# Patient Record
Sex: Female | Born: 1975 | Race: White | Hispanic: No | Marital: Married | Smoking: Never smoker
Health system: Southern US, Community
[De-identification: ages and names within clinical notes are randomized; demographics above are authoritative.]

## PROBLEM LIST (undated history)

## (undated) DIAGNOSIS — J189 Pneumonia, unspecified organism: Secondary | ICD-10-CM

## (undated) DIAGNOSIS — F909 Attention-deficit hyperactivity disorder, unspecified type: Secondary | ICD-10-CM

## (undated) DIAGNOSIS — M1990 Unspecified osteoarthritis, unspecified site: Secondary | ICD-10-CM

## (undated) DIAGNOSIS — E669 Obesity, unspecified: Secondary | ICD-10-CM

## (undated) DIAGNOSIS — I10 Essential (primary) hypertension: Secondary | ICD-10-CM

---

## 1997-10-29 ENCOUNTER — Emergency Department (HOSPITAL_COMMUNITY): Admission: EM | Admit: 1997-10-29 | Discharge: 1997-10-29 | Payer: Self-pay

## 1999-01-31 ENCOUNTER — Emergency Department (HOSPITAL_COMMUNITY): Admission: EM | Admit: 1999-01-31 | Discharge: 1999-02-01 | Payer: Self-pay | Admitting: *Deleted

## 1999-04-25 ENCOUNTER — Inpatient Hospital Stay (HOSPITAL_COMMUNITY): Admission: AD | Admit: 1999-04-25 | Discharge: 1999-04-25 | Payer: Self-pay | Admitting: Obstetrics

## 1999-04-25 ENCOUNTER — Encounter: Payer: Self-pay | Admitting: *Deleted

## 1999-05-29 ENCOUNTER — Inpatient Hospital Stay (HOSPITAL_COMMUNITY): Admission: AD | Admit: 1999-05-29 | Discharge: 1999-05-29 | Payer: Self-pay | Admitting: Obstetrics & Gynecology

## 1999-06-25 ENCOUNTER — Encounter: Admission: RE | Admit: 1999-06-25 | Discharge: 1999-06-25 | Payer: Self-pay | Admitting: Obstetrics

## 1999-07-03 ENCOUNTER — Ambulatory Visit (HOSPITAL_COMMUNITY): Admission: RE | Admit: 1999-07-03 | Discharge: 1999-07-03 | Payer: Self-pay | Admitting: Obstetrics & Gynecology

## 1999-07-15 ENCOUNTER — Encounter: Admission: RE | Admit: 1999-07-15 | Discharge: 1999-07-15 | Payer: Self-pay | Admitting: Obstetrics & Gynecology

## 1999-07-29 ENCOUNTER — Encounter: Admission: RE | Admit: 1999-07-29 | Discharge: 1999-07-29 | Payer: Self-pay | Admitting: Obstetrics & Gynecology

## 1999-08-05 ENCOUNTER — Ambulatory Visit (HOSPITAL_COMMUNITY): Admission: RE | Admit: 1999-08-05 | Discharge: 1999-08-05 | Payer: Self-pay

## 1999-08-05 ENCOUNTER — Encounter: Payer: Self-pay | Admitting: *Deleted

## 1999-09-10 ENCOUNTER — Encounter: Admission: RE | Admit: 1999-09-10 | Discharge: 1999-09-10 | Payer: Self-pay | Admitting: Obstetrics

## 1999-09-16 ENCOUNTER — Ambulatory Visit (HOSPITAL_COMMUNITY): Admission: RE | Admit: 1999-09-16 | Discharge: 1999-09-16 | Payer: Self-pay | Admitting: Obstetrics

## 1999-09-21 ENCOUNTER — Encounter (HOSPITAL_COMMUNITY): Admission: RE | Admit: 1999-09-21 | Discharge: 1999-10-23 | Payer: Self-pay | Admitting: *Deleted

## 1999-10-02 ENCOUNTER — Inpatient Hospital Stay (HOSPITAL_COMMUNITY): Admission: AD | Admit: 1999-10-02 | Discharge: 1999-10-05 | Payer: Self-pay | Admitting: *Deleted

## 1999-10-12 ENCOUNTER — Inpatient Hospital Stay (HOSPITAL_COMMUNITY): Admission: AD | Admit: 1999-10-12 | Discharge: 1999-10-12 | Payer: Self-pay | Admitting: Obstetrics

## 1999-10-19 ENCOUNTER — Inpatient Hospital Stay (HOSPITAL_COMMUNITY): Admission: AD | Admit: 1999-10-19 | Discharge: 1999-10-19 | Payer: Self-pay | Admitting: Obstetrics

## 1999-10-19 ENCOUNTER — Encounter: Payer: Self-pay | Admitting: Obstetrics

## 1999-10-22 ENCOUNTER — Inpatient Hospital Stay (HOSPITAL_COMMUNITY): Admission: AD | Admit: 1999-10-22 | Discharge: 1999-10-26 | Payer: Self-pay | Admitting: *Deleted

## 1999-10-23 ENCOUNTER — Encounter (INDEPENDENT_AMBULATORY_CARE_PROVIDER_SITE_OTHER): Payer: Self-pay

## 1999-10-29 ENCOUNTER — Inpatient Hospital Stay (HOSPITAL_COMMUNITY): Admission: AD | Admit: 1999-10-29 | Discharge: 1999-10-29 | Payer: Self-pay | Admitting: Obstetrics & Gynecology

## 2000-02-11 ENCOUNTER — Inpatient Hospital Stay (HOSPITAL_COMMUNITY): Admission: AD | Admit: 2000-02-11 | Discharge: 2000-02-11 | Payer: Self-pay | Admitting: Obstetrics & Gynecology

## 2000-05-11 ENCOUNTER — Encounter: Payer: Self-pay | Admitting: *Deleted

## 2000-05-11 ENCOUNTER — Emergency Department (HOSPITAL_COMMUNITY): Admission: EM | Admit: 2000-05-11 | Discharge: 2000-05-11 | Payer: Self-pay | Admitting: *Deleted

## 2001-09-12 ENCOUNTER — Other Ambulatory Visit: Admission: RE | Admit: 2001-09-12 | Discharge: 2001-09-12 | Payer: Self-pay | Admitting: Obstetrics & Gynecology

## 2001-09-13 ENCOUNTER — Encounter: Payer: Self-pay | Admitting: Obstetrics & Gynecology

## 2001-09-13 ENCOUNTER — Ambulatory Visit (HOSPITAL_COMMUNITY): Admission: RE | Admit: 2001-09-13 | Discharge: 2001-09-13 | Payer: Self-pay | Admitting: Obstetrics & Gynecology

## 2001-10-30 ENCOUNTER — Inpatient Hospital Stay (HOSPITAL_COMMUNITY): Admission: AD | Admit: 2001-10-30 | Discharge: 2001-10-30 | Payer: Self-pay | Admitting: *Deleted

## 2001-11-27 ENCOUNTER — Encounter: Payer: Self-pay | Admitting: Obstetrics

## 2001-11-27 ENCOUNTER — Ambulatory Visit (HOSPITAL_COMMUNITY): Admission: RE | Admit: 2001-11-27 | Discharge: 2001-11-27 | Payer: Self-pay | Admitting: Obstetrics

## 2002-03-12 ENCOUNTER — Inpatient Hospital Stay (HOSPITAL_COMMUNITY): Admission: AD | Admit: 2002-03-12 | Discharge: 2002-03-31 | Payer: Self-pay | Admitting: Obstetrics

## 2002-03-13 ENCOUNTER — Encounter: Payer: Self-pay | Admitting: Obstetrics

## 2002-03-16 ENCOUNTER — Encounter: Payer: Self-pay | Admitting: Obstetrics

## 2002-03-19 ENCOUNTER — Encounter: Payer: Self-pay | Admitting: Obstetrics

## 2002-03-22 ENCOUNTER — Encounter: Payer: Self-pay | Admitting: Obstetrics

## 2002-03-26 ENCOUNTER — Encounter: Payer: Self-pay | Admitting: Obstetrics & Gynecology

## 2002-03-27 ENCOUNTER — Encounter (INDEPENDENT_AMBULATORY_CARE_PROVIDER_SITE_OTHER): Payer: Self-pay | Admitting: Specialist

## 2003-06-07 ENCOUNTER — Emergency Department (HOSPITAL_COMMUNITY): Admission: EM | Admit: 2003-06-07 | Discharge: 2003-06-07 | Payer: Self-pay | Admitting: Emergency Medicine

## 2003-06-12 ENCOUNTER — Emergency Department (HOSPITAL_COMMUNITY): Admission: EM | Admit: 2003-06-12 | Discharge: 2003-06-12 | Payer: Self-pay | Admitting: Emergency Medicine

## 2004-01-07 ENCOUNTER — Ambulatory Visit: Payer: Self-pay | Admitting: Internal Medicine

## 2005-01-15 ENCOUNTER — Ambulatory Visit (HOSPITAL_COMMUNITY): Admission: RE | Admit: 2005-01-15 | Discharge: 2005-01-15 | Payer: Self-pay | Admitting: Obstetrics

## 2006-07-22 ENCOUNTER — Ambulatory Visit (HOSPITAL_COMMUNITY): Admission: RE | Admit: 2006-07-22 | Discharge: 2006-07-22 | Payer: Self-pay | Admitting: Obstetrics & Gynecology

## 2006-07-28 ENCOUNTER — Encounter: Admission: RE | Admit: 2006-07-28 | Discharge: 2006-10-26 | Payer: Self-pay | Admitting: Obstetrics & Gynecology

## 2006-10-20 ENCOUNTER — Encounter: Admission: RE | Admit: 2006-10-20 | Discharge: 2007-01-18 | Payer: Self-pay | Admitting: Obstetrics & Gynecology

## 2006-12-29 ENCOUNTER — Telehealth (INDEPENDENT_AMBULATORY_CARE_PROVIDER_SITE_OTHER): Payer: Self-pay | Admitting: Family Medicine

## 2007-02-27 ENCOUNTER — Encounter: Admission: RE | Admit: 2007-02-27 | Discharge: 2007-05-28 | Payer: Self-pay | Admitting: Obstetrics & Gynecology

## 2007-04-27 ENCOUNTER — Encounter: Payer: Self-pay | Admitting: Obstetrics & Gynecology

## 2007-04-27 ENCOUNTER — Ambulatory Visit (HOSPITAL_COMMUNITY): Admission: RE | Admit: 2007-04-27 | Discharge: 2007-04-27 | Payer: Self-pay | Admitting: Obstetrics & Gynecology

## 2008-10-07 ENCOUNTER — Ambulatory Visit: Payer: Self-pay | Admitting: Physician Assistant

## 2008-10-07 LAB — CONVERTED CEMR LAB
ALT: 14 units/L (ref 0–35)
AST: 13 units/L (ref 0–37)
Albumin: 4.3 g/dL (ref 3.5–5.2)
Alkaline Phosphatase: 50 units/L (ref 39–117)
BUN: 11 mg/dL (ref 6–23)
Basophils Absolute: 0 10*3/uL (ref 0.0–0.1)
Basophils Relative: 0 % (ref 0–1)
CO2: 17 meq/L — ABNORMAL LOW (ref 19–32)
Calcium: 8.8 mg/dL (ref 8.4–10.5)
Chloride: 110 meq/L (ref 96–112)
Creatinine, Ser: 0.68 mg/dL (ref 0.40–1.20)
Eosinophils Absolute: 0.1 10*3/uL (ref 0.0–0.7)
Eosinophils Relative: 2 % (ref 0–5)
Glucose, Bld: 92 mg/dL (ref 70–99)
HCT: 41 % (ref 36.0–46.0)
Hemoglobin: 13.1 g/dL (ref 12.0–15.0)
Lymphocytes Relative: 29 % (ref 12–46)
Lymphs Abs: 1.4 10*3/uL (ref 0.7–4.0)
MCHC: 32 g/dL (ref 30.0–36.0)
MCV: 87.4 fL (ref 78.0–100.0)
Monocytes Absolute: 0.3 10*3/uL (ref 0.1–1.0)
Monocytes Relative: 6 % (ref 3–12)
Neutro Abs: 3 10*3/uL (ref 1.7–7.7)
Neutrophils Relative %: 63 % (ref 43–77)
Platelets: 186 10*3/uL (ref 150–400)
Potassium: 4.4 meq/L (ref 3.5–5.3)
RBC: 4.69 M/uL (ref 3.87–5.11)
RDW: 15.1 % (ref 11.5–15.5)
Sodium: 142 meq/L (ref 135–145)
TSH: 2.285 microintl units/mL (ref 0.350–4.500)
Total Bilirubin: 0.3 mg/dL (ref 0.3–1.2)
Total Protein: 6.7 g/dL (ref 6.0–8.3)
WBC: 4.7 10*3/uL (ref 4.0–10.5)

## 2008-10-08 ENCOUNTER — Encounter: Payer: Self-pay | Admitting: Physician Assistant

## 2008-10-11 ENCOUNTER — Ambulatory Visit: Payer: Self-pay | Admitting: Physician Assistant

## 2008-10-17 ENCOUNTER — Ambulatory Visit: Payer: Self-pay | Admitting: Physician Assistant

## 2008-10-29 ENCOUNTER — Ambulatory Visit: Payer: Self-pay | Admitting: Internal Medicine

## 2008-11-06 ENCOUNTER — Ambulatory Visit: Payer: Self-pay | Admitting: Family Medicine

## 2008-11-18 ENCOUNTER — Ambulatory Visit: Payer: Self-pay | Admitting: Physician Assistant

## 2008-11-18 DIAGNOSIS — F4310 Post-traumatic stress disorder, unspecified: Secondary | ICD-10-CM

## 2009-02-05 ENCOUNTER — Emergency Department (HOSPITAL_COMMUNITY): Admission: EM | Admit: 2009-02-05 | Discharge: 2009-02-05 | Payer: Self-pay | Admitting: Emergency Medicine

## 2009-02-17 ENCOUNTER — Ambulatory Visit: Payer: Self-pay | Admitting: Physician Assistant

## 2009-02-17 DIAGNOSIS — R030 Elevated blood-pressure reading, without diagnosis of hypertension: Secondary | ICD-10-CM

## 2009-02-26 ENCOUNTER — Ambulatory Visit: Payer: Self-pay | Admitting: Physician Assistant

## 2009-03-26 ENCOUNTER — Ambulatory Visit: Payer: Self-pay | Admitting: Physician Assistant

## 2009-04-03 ENCOUNTER — Telehealth: Payer: Self-pay | Admitting: Physician Assistant

## 2009-04-30 ENCOUNTER — Ambulatory Visit: Payer: Self-pay | Admitting: Physician Assistant

## 2009-05-26 ENCOUNTER — Ambulatory Visit: Payer: Self-pay | Admitting: Physician Assistant

## 2009-06-20 ENCOUNTER — Ambulatory Visit: Payer: Self-pay | Admitting: Physician Assistant

## 2009-10-27 ENCOUNTER — Emergency Department (HOSPITAL_COMMUNITY): Admission: EM | Admit: 2009-10-27 | Discharge: 2009-10-27 | Payer: Self-pay | Admitting: Emergency Medicine

## 2010-03-29 ENCOUNTER — Encounter: Payer: Self-pay | Admitting: Obstetrics & Gynecology

## 2010-04-09 NOTE — Progress Notes (Signed)
Summary: review med request  Phone Note Call from Patient Call back at [phone]   Summary of Call: Mariana Wiederholt PT. [place] [facility] CALLED AND WANTS TO KNOW IF ITS OK TO TAKE THIS VITAMIN CALLED [facility]. IT HAS BIOTIN 1MG , ACETYL-L-CARNITINE 250MG , PHYTOSONE 400MG , AND L-CARNINTINE 250MG . SHE WANTS TO KNOW IF IT WILL BE SAFE. Initial call taken by: Leodis Rains,  April 03, 2009 1:06 PM  Follow-up for Phone Call        will forward to provider for review.... Follow-up by: Mikey College CMA,  April 04, 2009 10:06 AM  Additional Follow-up for Phone Call Additional follow up Details #1::        yes Additional Follow-up by: Brynda Rim,  April 04, 2009 3:15 PM    Additional Follow-up for Phone Call Additional follow up Details #2::    spoke with pt and let her know its okay to take med Follow-up by: [place] Shannon,  April 09, 2009 11:20 AM

## 2010-05-22 LAB — URINALYSIS, ROUTINE W REFLEX MICROSCOPIC
Bilirubin Urine: NEGATIVE
Glucose, UA: NEGATIVE mg/dL
Hgb urine dipstick: NEGATIVE
Ketones, ur: NEGATIVE mg/dL
Nitrite: NEGATIVE
Protein, ur: NEGATIVE mg/dL
Specific Gravity, Urine: 1.028 (ref 1.005–1.030)
Urobilinogen, UA: 0.2 mg/dL (ref 0.0–1.0)
pH: 6 (ref 5.0–8.0)

## 2010-05-22 LAB — POCT I-STAT, CHEM 8
BUN: 11 mg/dL (ref 6–23)
Calcium, Ion: 1.11 mmol/L — ABNORMAL LOW (ref 1.12–1.32)
Chloride: 109 mEq/L (ref 96–112)
Creatinine, Ser: 0.8 mg/dL (ref 0.4–1.2)
Glucose, Bld: 95 mg/dL (ref 70–99)
HCT: 41 % (ref 36.0–46.0)
Hemoglobin: 13.9 g/dL (ref 12.0–15.0)
Potassium: 3.9 mEq/L (ref 3.5–5.1)
Sodium: 142 mEq/L (ref 135–145)
TCO2: 23 mmol/L (ref 0–100)

## 2010-05-22 LAB — PREGNANCY, URINE: Preg Test, Ur: NEGATIVE

## 2010-07-21 NOTE — Op Note (Signed)
[name], Sue Flores            ACCOUNT NO.:  [id number]   MEDICAL RECORD NO.:  [id number]          PATIENT TYPE:  AMB   LOCATION:  SDC                           FACILITY:  [facility]   PHYSICIAN:  Roseanna Rainbow, M.D.DATE OF BIRTH:  13-Feb-1976   DATE OF PROCEDURE:  04/27/2007  DATE OF DISCHARGE:                               OPERATIVE REPORT   PREOPERATIVE DIAGNOSIS:  Dysfunctional uterine bleeding.   POSTOPERATIVE DIAGNOSIS:  Dysfunctional uterine bleeding.   PROCEDURE:  Diagnostic dilatation and curettage, hysteroscopy, NovaSure  ablation.   SURGEON:  Roseanna Rainbow, M.D.   ANESTHESIA:  Laryngeal mask airway, paracervical block.   PATHOLOGY:  Endometrial curettings.   ESTIMATED BLOOD LOSS:  Minimal.   COMPLICATIONS:  None.   PROCEDURES:  The patient was taken to the operating room with an IV  running.  A laryngeal mask airway was placed.  She was then placed in  the dorsal lithotomy position and prepped and draped in the usual  sterile fashion.  After a time-out had been completed, a weighted  speculum and Sims retractor were then placed into the vagina.  The  anterior lip of the cervix was infiltrated with 2 mL of 1% lidocaine.  The single-tooth tenaculum was then applied to this location.  4 mL of  1% lidocaine were then injected at 4 and 7 o'clock to produce a  paracervical block.  The cervix was dilated with Shawnie Pons dilators.  The  NovaSure sound was then introduced to obtain both cervical length and  intracavitary length measurements.  These were 6 and 4 cm, respectively.  A diagnostic hysteroscopy was then performed using lactated Ringer's as  a distending medium.  The endometrial lining had a somewhat polypoid  appearance.  However, there are no discrete lesions noted.  A sharp  curettage was then performed.  The NovaSure device was then introduced  into the intrauterine cavity and seated.  The intercornual width was 4  cm.  The intracavitary integrity  test was then performed and passed.  The ablation cycle was then started and completed.  The device was then  removed.  A repeat hysteroscopic survey of the endometrial cavity was  consistent with satisfactory results from the ablation.  All the  instruments were then removed.  There is minimal bleeding noted from the  cervix.  At the close of the procedure, the instrument and pack counts  were said to be correct x2.  The patient was taken to the PACU awake and  in stable condition.      Roseanna Rainbow, M.D.  Electronically Signed    LAJ/MEDQ  D:  04/27/2007  T:  04/28/2007  Job:  [id number]
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
## 2010-07-24 NOTE — Op Note (Signed)
[facility]  Patient:    Sue Flores, Sue Flores                       MRN: [id number] Proc. Date: 10/24/99 Adm. Date:  [id number] Disc. Date: [id number] Attending:  Michaelle Copas                           Operative Report  PREOPERATIVE DIAGNOSES:       56. A 35 year old intrauterine pregnancy, twin                                  gestation.                               2. Intrauterine growth retardation.                               3. History of previous cesarean, declines trial                                  of labor.  POSTOPERATIVE DIAGNOSES:      31. A 35 year old intrauterine pregnancy, twin                                  gestation.                               2. Intrauterine growth retardation.                               3. History of previous cesarean, declines trial                                  of labor.                               4. Right ovarian cyst.  OPERATION:                    1. Tertiary low transverse cesarean section.                               2. Modified Pomeroy bilateral tubal ligation.                               3. Right ovarian cystectomy via Pfannenstiel.  SURGEON:                      Roseanna Rainbow, M.D.  ASSISTANT:  ANESTHESIA:                   Epidural anesthesia.  COMPLICATIONS:                None.  ESTIMATED BLOOD LOSS:  One liter.  FLUIDS:                       Two liters lactated Ringers.  URINE OUTPUT:                 100 cc of clear urine.  INDICATIONS:                  The patient is a 35 year old para 2 at 13 weeks with a twin gestation complicated by intrauterine growth restriction.  She was undergoing induction of labor with oxytocin, maximum dilatation 3 cm, at which point she declined for further efforts at a trial of labor.  FINDINGS:                     The presenting infant was vertex.  The twin B was breech.  There was a right-sided ovarian cyst  approximately 5 x 5 cm with a mucinous material in the contents.  DESCRIPTION OF PROCEDURE:     The patient was taken to the operating room where epidural anesthetic was found to be adequate. She was then prepped and draped in the normal sterile fashion in the dorsal supine position with a leftward tilt.  A Pfannenstiel skin incision was then made with a scalpel and carried through to the underlying layer of fascia.  The fascia was incised in the midline and the incision extended laterally with Mayo scissors.  The superior aspect of the fascial incision was then grasped with Kocher clamps, elevated and the underlying rectus muscles dissected off.  Attention was then turned to the inferior aspect of this incision which was manipulated in a similar fashion.  The rectus muscles were separated in the midline.  Of note, at this point the omentum was noted to be adherent to the parietoperitoneum and rectus muscles.  A window was noted in the parietoperitoneum and this was extended sharply.  As the exposure was limited, the rectus muscles were divided transversely with cautery approximately one third of the belly on each side.  The bladder blade was inserted and the lower uterine segment incised in a transverse fashion with the scalpel.  The bladder blade was inserted and the vesicouterine peritoneum identified, grasped with pickups and entered sharply with Metzenbaum scissors.  This incision was extended laterally and the bladder flap created sharply.  The bladder blade was reinserted and the lower uterine segment incised in a transverse fashion with the scalpel. The uterine incision was then extended bluntly.  The bladder blade was removed and infant As head was delivered atraumatically. The nose and mouth were suctioned with the bulb suction and the cord clamped and cut.  The infant was handed off to the awaiting pediatrician.  Twin B was delivered by a complete breech extraction.  The cord  was clamped and cut.  The infant was handed off again to the awaiting pediatricians.  The placenta was then removed. The uterus exteriorized and cleared of all clots and debris. The uterine incision was repaired with 0 Monocryl in a running locked fashion.  Excellent hemostasis was noted.  Attention was then turned toward the fallopian tubes.  The left fallopian tube was identified, grasped with a Babcock clamp.  The clamp was used to grasp the tube approximately 4 cm from the cornu.  A 2 to 3 cm segment of tube was then doubly ligated with three ties of plain gut and excised. Excellent hemostasis was noted. The right fallopian tube  was then manipulated in a similar fashion.  The right ovarian cyst was noted as above.  The right ovarian capsule was cored with the capsule circumferentially at the interface between the cyst and the ovarian tissue.  The cortex was then incised along the area that had been scored with the scalpel.  The dissection was performed of the tissue between the cyst and the ovarian capsule. The cyst was then removed.  The bleeding points in the ovarian cortex were then secured with hemostats and suture ligated with 3-0 Monocryl.  Excellent hemostasis was noted.  The remaining ovarian cortex was then reapproximated with vertical mattress sutures.  Again, excellent hemostasis was noted.  The uterus was then returned to the abdomen.  The gutters were cleared of all clots. The fascia was reapproximated with 0 PDS in a running fashion. The skin was closed with staples. The patient tolerated the procedure well.  Sponge, lap and needle counts correct x 2.  There were 2 g of cefazolin given at cord clamp.  The patient was taken to the PACU in stable condition. DD:  10/26/99 TD:  10/27/99 Job: [id number]

## 2010-07-24 NOTE — Op Note (Signed)
Sue Flores                          ACCOUNT NO.:  [id number]   MEDICAL RECORD NO.:  [id number]                   PATIENT TYPE:  INP   LOCATION:  9180                                 FACILITY:  [facility]   PHYSICIAN:  Roseanna Rainbow, M.D.         DATE OF BIRTH:  Mar 10, 1975   DATE OF PROCEDURE:  03/27/2002  DATE OF DISCHARGE:                                 OPERATIVE REPORT   PREOPERATIVE DIAGNOSES:  1. Thirty-six plus weeks' intrauterine pregnancy.  2. Severe pregnancy-induced hypertension.  3. History of two previous cesarean deliveries, elects to proceed with     repeat cesarean delivery.  4. Failed tubal ligation.   PROCEDURES:  1. Repeat low transverse cesarean section.  2. Bilateral salpingectomies via Pfannenstiel.   SURGEON:  Roseanna Rainbow, M.D.   ANESTHESIA:  Spinal.   COMPLICATIONS:  None.   ESTIMATED BLOOD LOSS:  800 cc.   FLUIDS AND URINE OUTPUT:  As per anesthesiology.   INDICATIONS FOR PROCEDURE:  The patient is a 35 year old multipara at 6 +  weeks' with severe pregnancy-induced hypertension.   FINDINGS:  Female infant in cephalic presentation.  Neonatology present at  delivery.  Normal uterus and ovaries.  The isthmic portions of the tubes  were attenuated bilaterally.   PROCEDURES:  The patient was taken to the operating room where her spinal  anesthetic was found to be adequate.  She was then prepped and draped in the  usual sterile fashion in a dorsal supine position with leftward tilt.  A  Pfannenstiel skin incision was then made with scalpel and carried through to  the underlying layer of fascia.  The fascia was incised in the midline and  the incision extended laterally with Mayo scissors.  The superior aspect of  the fascial incision was then grasped with Kocher clamps, elevated, and the  underlying rectus muscles dissected off sharply.  Attention was then turned  to the inferior aspect of this incision which was manipulated in  a similar  fashion.  The rectus muscles were separated in the midline.  The parietal  peritoneum was tented up and entered sharply with Metzenbaum scissors.  The  peritoneal incision was then extended superiorly and inferiorly with good  visualization of the bladder.  At this point, there were adhesions noted  involving the omentum to the parietal peritoneum extending above the  bladder.  These adhesions were clamped with Kelly clamps, divided, and  sutured with free ligatures of 2-0 Vicryl.  Excellent hemostasis was noted.  To achieve better exposure, the medial aspects of the bellies of the rectus  muscles were divided approximately one-third horizontally with the Bovie.  The bladder flap was created sharply.  The bladder blade was then reinserted  and the lower uterine segment incised in a transverse fashion with the  scalpel.  The uterine incision was then extended laterally bluntly.  The  bladder blade was then removed and the infant's head  delivered  atraumatically.  The nose and mouth were suctioned with the bulb suction.  The cord and clamped and cut.  The infant was handed off  to the waiting  neonatologist.  Cord gases were sent.  The placenta was then removed.  The  uterus exteriorized and cleared of all clots and debris.  The uterine  incision was repaired with 0 Monocryl in a running lock fashion.  Excellent  hemostasis was noted.  At this point, the mesosalpinx was serially clamped  progressing from the fimbriated end to the interstitial portion of the tube  on the right.  The pedicles were secured with free ligatures of 2-0 Vicryl.  This was repeated on the patient's left.  Again, excellent hemostasis was  noted.  The uterus was returned to the abdomen.  The gutters were cleared of  all clots and debris.  The fascia was reapproximated with 0 PDS in a running  fashion.  The subcutaneous fat layer was reapproximated with 3-0 plain.  The  skin was closed with staples.  The  patient tolerated the procedure well.  The sponge, lap, needle, and instrument counts were correct x2.  One gram of  cephazolin was given at cord clamp.  The patient was taken to the PACU in  stable condition.                                               Roseanna Rainbow, M.D.    Sue Flores  D:  03/27/2002  T:  03/27/2002  Job:  [id number]

## 2010-07-24 NOTE — Discharge Summary (Signed)
[facility]  Patient:    Sue Flores, Sue Flores                       MRN: [id number] Adm. Date:  [id number] Disc. Date: [id number] Attending:  Antionette Char                           Discharge Summary  PROCEDURES:                   1. Tertiary low transverse cesarean section.                               2. Modified Pomeroy bilateral tubal ligation.                               3. Right ovarian cystectomy via Pfannenstiel.  HOSPITAL SUMMARY:             The patient is a 35 year old, G3, now para 3, who presented at 2 weeks with twin gestation complicated by intrauterine growth restriction.  She was undergoing induction of labor with oxytocin with a maximum dilatation of 3 cm, at which point she declined further efforts at a trial of labor.  The patient was taken to the operating room and a tertiary low transverse cesarean section was performed by Roseanna Rainbow, M.D. The presenting infant was vertex.  Twin B was breech.  There was a right-sided ovarian cyst approximately 5 x 5 cm with mucinous material and contacts was also found.  The patient underwent these procedures via epidural anesthesia. Baby A weighed 4 pounds 10 ounces.  Baby B weighed 3 pounds 10 ounces.  The patient tolerated the procedure well and was taken to the recovery room and then to mother/baby in stable condition.  The patient did well postoperatively and received routine postoperative care.  On the third day postoperatively, the mother appeared to be doing quite well and recovering without problems. She was breast feeding.  She was deemed stable to be discharged to home.  DISCHARGE CONDITION:          Good.  DISPOSITION:                  Discharged to home.  DISCHARGE MEDICATIONS:        1. Tylenol No. 3 one tablet p.o. q.4h. as needed                                  for pain.                               2. FESO4 one tablet p.o. b.i.d.  WOUND CARE:                    The patient was given specific wound care instructions.  DIET:                         The patient was told to resume a regular diet.  ACTIVITY:                     The patient is to have no heavy lifting.  Per the booklet, the patient is to have vaginal rest for six weeks with no sexual activity.  FOLLOW-UP:                    The patient will follow up in three days at [facility] for staple removal.  The patient will follow up with her OB/GYN at her six-week postpartum visit.  The patient voiced agreement and understanding of the above discharge plans and had no further questions. DD:  01/01/00 TD:  01/01/00 Job: [id number]

## 2010-07-24 NOTE — Discharge Summary (Signed)
Sue Flores, Sue Flores                          ACCOUNT NO.:  [id number]   MEDICAL RECORD NO.:  [id number]                   PATIENT TYPE:  INP   LOCATION:  9180                                 FACILITY:  [facility]   PHYSICIAN:  Charles A. Clearance Coots, M.D.             DATE OF BIRTH:  [birth year]   DATE OF ADMISSION:  03/12/2002  DATE OF DISCHARGE:                                 DISCHARGE SUMMARY   ADMISSION DIAGNOSES:  1. [redacted] weeks gestation.  2. Pre-eclampsia with thrombocytopenia.   DISCHARGE DIAGNOSES:  1. [redacted] weeks gestation.  2. Pre-eclampsia with thrombocytopenia.  3. Status post repeat low transverse Cesarean section and bilateral     salpingectomy.  4. Delivered viable female infant on March 27, 2002 at 0600 with Apgar's of     9 at one minute and 9 at five minutes and weight of 2585 grams and length     of 48 cm.   CONDITION ON DISCHARGE:  Mother and infant discharged home in good  condition.   REASON FOR ADMISSION:  The patient is a 35 year old white female, Gravida  IV, Para IV with an estimated date of confinement of about April 22, 2002, now at [redacted] weeks gestation with pre-eclampsia with thrombocytopenia.  Prenatal care had been done at the [facility] by Dr. Ilene QuaChristell Constant. The patient has had a rather complicated prenatal course until  development of increased blood pressure, headache, and low platelets over  the past week. The patient was therefore admitted to the hospital for  bedrest and supportive management.   PAST SURGICAL HISTORY:  Cesarean section times two with tubal ligation after  the second Cesarean section.   PAST MEDICAL HISTORY:  Depression.   MEDICATIONS:  Aldomet and prenatal vitamins.   ALLERGIES:  No known drug allergies.   PAST OB-GYN HISTORY:  Two previous C-sections, bilateral tubal ligation as  well as ovarian cystectomy after the second Cesarean section. Negative  history of sexually transmitted diseases.   SOCIAL  HISTORY:  Single. Works as a Conservation officer, nature at [employer]. Denies tobacco,  alcohol, or recreations drug use.   PHYSICAL EXAMINATION:  GENERAL: Obese white female in no acute distress.  VITAL SIGNS: Blood pressure 136/88. Weight 270 pounds.  ABDOMEN: Gravid and nontender. Fetal heart tones are 120 to 130 beats per  minute.  GU: Cervical examination was omitted.  EXTREMITIES: Hand and lower extremity pre-tibial edema, 1-2+. Deep tendon  reflexes are 3+ patella.   LABORATORY DATA:  UA was negative for protein.   IMPRESSION:  Multipara at [redacted] weeks gestation with pre-eclampsia associated  with mild thrombocytopenia.   PLAN:  Admit. Repeat pre-eclampsia labs and obtain a complete OB ultrasound  for growth and will start a 24 hour urine for creatinine clearance and total  protein.   LABORATORY DATA:  Hemoglobin and hematocrit 10.5 and 30.7. WBC count 9,000.  Platelets 114,000. CMP remarkable for  creatinine of 0.5, uric acid of 4.4.  Other liver enzymes were within normal limits.   HOSPITAL COURSE:  The patient was admitted and placed on bedrest and  responded well to bedrest. First 24 hour urine done on March 16, 2002  revealed total protein of 160 mg over 24 hour period. She continued to  respond well to bedrest and her platelet count ranged from 114 to 118,000 up  until March 22, 2002 when the platelet count dropped to 110,000. Repeat 24  hour urine was done and revealed total protein of 242 mg per 24 hours. The  patient continued on bedrest and on March 26, 2002 the platelet count was  100,000. The decision was made to proceed with Cesarean section delivery  because of continued thrombocytopenia, now at a critical level of 100,000  platelet count. Recommendation was explained to the patient and she agreed  to the procedure. The risks and complications and benefits of the procedure  were explained. The patient desired to have definitive sterilization  procedure done at the time of  Cesarean section with removal of both tubes  and this was also agreed to. She is taken for Cesarean section delivery on  March 27, 2002. She underwent repeat low transverse Cesarean section and  bilateral salpingectomy. There are no intraoperative complications.  Postoperative and postpartum course were uncomplicated.  The patient and  infant were discharged home on postoperative day four in good condition.   DISCHARGE LABORATORY DATA:  Hemoglobin and hematocrit 9.6 and 28.5, WBC  count 7,600, platelets 103,000.   DISCHARGE MEDICATIONS:  1. Tylox 1-2 tabs every four to six hours as needed pain.  2. Ibuprofen 800 mg every eight hours to supplement the Tylox as needed for     pain.  3. Continue prenatal vitamins daily.  4. Hemocyte plus one tab by mouth daily.   SPECIAL INSTRUCTIONS:  The patient was given routine written instructions  per booklet for OB discharge.   FOLLOW UP:  To follow-up at the [facility] with Dr. Tamela Oddi  in six weeks.                                               Charles A. Clearance Coots, M.D.    CAH/MEDQ  D:  03/31/2002  T:  03/31/2002  Job:  [id number]   cc:   Leonette Most A. Clearance Coots, M.D.  [address]  [place]  [place] [address]  Fax: [phone]
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
## 2010-11-30 LAB — CBC
HCT: 37.3
Hemoglobin: 13
MCHC: 34.7
MCV: 81.1
Platelets: 193
RBC: 4.6
RDW: 14.7
WBC: 7.3

## 2011-07-12 ENCOUNTER — Observation Stay (HOSPITAL_COMMUNITY)
Admission: EM | Admit: 2011-07-12 | Discharge: 2011-07-13 | Disposition: A | Discharge status: Home / Self Care | Payer: Self-pay | Attending: Emergency Medicine | Admitting: Emergency Medicine

## 2011-07-12 ENCOUNTER — Encounter (HOSPITAL_COMMUNITY): Payer: Self-pay | Admitting: Emergency Medicine

## 2011-07-12 ENCOUNTER — Emergency Department (HOSPITAL_COMMUNITY): Payer: Self-pay

## 2011-07-12 DIAGNOSIS — R030 Elevated blood-pressure reading, without diagnosis of hypertension: Secondary | ICD-10-CM

## 2011-07-12 DIAGNOSIS — I10 Essential (primary) hypertension: Secondary | ICD-10-CM | POA: Insufficient documentation

## 2011-07-12 DIAGNOSIS — R079 Chest pain, unspecified: Principal | ICD-10-CM | POA: Insufficient documentation

## 2011-07-12 DIAGNOSIS — E669 Obesity, unspecified: Secondary | ICD-10-CM | POA: Insufficient documentation

## 2011-07-12 HISTORY — DX: Obesity, unspecified: E66.9

## 2011-07-12 HISTORY — DX: Essential (primary) hypertension: I10

## 2011-07-12 LAB — CBC
HCT: 37.6 % (ref 36.0–46.0)
Hemoglobin: 12.6 g/dL (ref 12.0–15.0)
MCH: 28 pg (ref 26.0–34.0)
MCHC: 33.5 g/dL (ref 30.0–36.0)
MCV: 83.6 fL (ref 78.0–100.0)
Platelets: 179 10*3/uL (ref 150–400)
RBC: 4.5 MIL/uL (ref 3.87–5.11)
RDW: 14.1 % (ref 11.5–15.5)

## 2011-07-12 LAB — DIFFERENTIAL
Basophils Absolute: 0 10*3/uL (ref 0.0–0.1)
Eosinophils Absolute: 0.1 10*3/uL (ref 0.0–0.7)
Eosinophils Relative: 2 % (ref 0–5)
Lymphs Abs: 1.5 10*3/uL (ref 0.7–4.0)
Monocytes Absolute: 0.4 10*3/uL (ref 0.1–1.0)
Monocytes Relative: 6 % (ref 3–12)
Neutro Abs: 4.3 10*3/uL (ref 1.7–7.7)
Neutrophils Relative %: 68 % (ref 43–77)

## 2011-07-12 LAB — POCT I-STAT TROPONIN I
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
## 2011-07-12 NOTE — ED Notes (Signed)
PT. REPORTS MID CHEST PRESSURE FOR SEVERAL DAYS WITH SOB , DENIES COUGH OR NAUSEA.

## 2011-07-13 ENCOUNTER — Other Ambulatory Visit: Payer: Self-pay

## 2011-07-13 DIAGNOSIS — R072 Precordial pain: Secondary | ICD-10-CM

## 2011-07-13 LAB — D-DIMER, QUANTITATIVE: D-Dimer, Quant: 0.35 ug/mL-FEU (ref 0.00–0.48)

## 2011-07-13 LAB — BASIC METABOLIC PANEL
BUN: 13 mg/dL (ref 6–23)
CO2: 24 mEq/L (ref 19–32)
Calcium: 9.4 mg/dL (ref 8.4–10.5)
Chloride: 105 mEq/L (ref 96–112)
Creatinine, Ser: 0.89 mg/dL (ref 0.50–1.10)
GFR calc non Af Amer: 82 mL/min — ABNORMAL LOW (ref >90)
Glucose, Bld: 106 mg/dL — ABNORMAL HIGH (ref 70–99)
Potassium: 3.8 mEq/L (ref 3.5–5.1)
Sodium: 140 mEq/L (ref 135–145)

## 2011-07-13 LAB — POCT I-STAT TROPONIN I
Troponin i, poc: 0.02 ng/mL (ref 0.00–0.08)
Troponin i, poc: 0.01 ng/mL (ref 0.00–0.08)

## 2011-07-13 MED ORDER — SODIUM CHLORIDE 0.9 % IV SOLN
1000.0000 mL | INTRAVENOUS | Status: DC
  Administered 2011-07-13 (×2): 1000 mL via INTRAVENOUS

## 2011-07-13 MED ORDER — MORPHINE SULFATE 4 MG/ML IJ SOLN
4.0000 mg | Freq: Once | INTRAMUSCULAR | Status: AC
  Administered 2011-07-13: 4 mg via INTRAVENOUS
  Filled 2011-07-13: qty 1

## 2011-07-13 MED ORDER — MORPHINE SULFATE 4 MG/ML IJ SOLN
2.0000 mg | Freq: Once | INTRAMUSCULAR | Status: AC
  Administered 2011-07-13: 2 mg via INTRAVENOUS
  Filled 2011-07-13: qty 1

## 2011-07-13 MED ORDER — KETOROLAC TROMETHAMINE 30 MG/ML IJ SOLN
30.0000 mg | Freq: Once | INTRAMUSCULAR | Status: AC
  Administered 2011-07-13: 30 mg via INTRAVENOUS
  Filled 2011-07-13: qty 1

## 2011-07-13 MED ORDER — ASPIRIN 81 MG PO CHEW
324.0000 mg | CHEWABLE_TABLET | Freq: Once | ORAL | Status: AC
  Administered 2011-07-13: 324 mg via ORAL
  Filled 2011-07-13: qty 4

## 2011-07-13 MED ORDER — LORAZEPAM 1 MG PO TABS
1.0000 mg | ORAL_TABLET | Freq: Once | ORAL | Status: AC
  Administered 2011-07-13: 1 mg via ORAL
  Filled 2011-07-13: qty 1

## 2011-07-13 MED ORDER — ALBUTEROL SULFATE HFA 108 (90 BASE) MCG/ACT IN AERS
2.0000 | INHALATION_SPRAY | RESPIRATORY_TRACT | Status: DC | PRN
  Filled 2011-07-13: qty 6.7

## 2011-07-13 NOTE — ED Notes (Signed)
Repeat EKG was done and given to Dr.Miller. ?

## 2011-07-13 NOTE — Progress Notes (Signed)
  Echocardiogram Echocardiogram Stress Test has been performed.  Dorena Cookey 07/13/2011, 11:35 AM

## 2011-07-13 NOTE — ED Notes (Signed)
RT called to give inhaler to pt with education.

## 2011-07-13 NOTE — ED Provider Notes (Signed)
Assumed care at 62:58 AM. 36 year old female presents with, currently on chest pain protocol. Patient has had negative serial troponin and unchanged EKGs throughout the night.  Plan for stress echocardiogram this morning secondary to persistent mild left-sided chest pain. She does not have a primary care Dr., and request for resources.  Alert and oriented and in no acute distress. On exam, heart with regular rate and rhythm, no murmurs rubs, or gallops.  Lungs clear to auscultation bilaterally, abdomen is soft, nontender with normal bowel sounds. No evidence of pedal edema.    9:44 AM Pt c/o cp prior to stress echocardiogram.  Will continue with pain management until chest pain resolves prior to stress echo.    11:11 AM Radiologist, Dr. Myrtis Ser called to notify that stress echo is non-diagnostic as pt were experiencing CP and her heart rates increases to 120s.  Pt has had 3 negative serial troponin and 3 sets of ECG that shows no changes.  Although pt does not have any significant risk factors for PE, her O2 sats were at 92% earlier during the night.  Will obtain d-dimer to r/o PE.  Pt currently in NAD.  Discussed with my attending.   1:32 PM Pt has negative d-dimer.  Is chest pain free.  Recommend f/u with cardiology for further evaluation.  Pt voice understanding and agrees with plan.    Fayrene Helper, PA-C 07/13/11 1333

## 2011-07-13 NOTE — ED Notes (Signed)
Pt transported to [facility] for stress test.

## 2011-07-13 NOTE — ED Notes (Signed)
RT at bedside.

## 2011-07-13 NOTE — ED Notes (Signed)
The pts pressure is better but sh has had a sharp pain beneath her lt breast since she has been in this room

## 2011-07-13 NOTE — ED Notes (Signed)
[name] from [facility] contacted RN stating pt can not be on O2 for test. NT went to remove pt from O2 per Greta Doom, PA's order. Pt also c/o chest pressure- PA made aware and no new orders received. [name] states, they may be unable to do test if pt is having chest pressure.

## 2011-07-13 NOTE — ED Notes (Signed)
The pt has had chest pressure for 2hours worse with inspiration.  She has a history of the same

## 2011-07-13 NOTE — ED Notes (Signed)
Pt returned from Stress Test, pt still c/o chest pressure and pain under left breast. Orders received for Morphine, will give medication and continue to monitor.

## 2011-07-13 NOTE — Discharge Instructions (Signed)
Evaluate for chest pain. Your stress test is nondiagnostic at this time. Please follow up with cardiologist for further evaluation. Return to ER if you have any concerns or if your chest pain worsen.  You may use inhaler (2 puffs every 6 hours as needed for shortness of breath).  Chest Pain (Nonspecific) It is often hard to give a specific diagnosis for the cause of chest pain. There is always a chance that your pain could be related to something serious, such as a heart attack or a blood clot in the lungs. You need to follow up with your caregiver for further evaluation. CAUSES   Heartburn.   Pneumonia or bronchitis.   Anxiety or stress.   Inflammation around your heart (pericarditis) or lung (pleuritis or pleurisy).   A blood clot in the lung.   A collapsed lung (pneumothorax). It can develop suddenly on its own (spontaneous pneumothorax) or from injury (trauma) to the chest.   Shingles infection (herpes zoster virus).  The chest wall is composed of bones, muscles, and cartilage. Any of these can be the source of the pain.  The bones can be bruised by injury.   The muscles or cartilage can be strained by coughing or overwork.   The cartilage can be affected by inflammation and become sore (costochondritis).  DIAGNOSIS  Lab tests or other studies, such as X-rays, electrocardiography, stress testing, or cardiac imaging, may be needed to find the cause of your pain.  TREATMENT   Treatment depends on what may be causing your chest pain. Treatment may include:   Acid blockers for heartburn.   Anti-inflammatory medicine.   Pain medicine for inflammatory conditions.   Antibiotics if an infection is present.   You may be advised to change lifestyle habits. This includes stopping smoking and avoiding alcohol, caffeine, and chocolate.   You may be advised to keep your head raised (elevated) when sleeping. This reduces the chance of acid going backward from your stomach into your  esophagus.   Most of the time, nonspecific chest pain will improve within 2 to 3 days with rest and mild pain medicine.  HOME CARE INSTRUCTIONS   If antibiotics were prescribed, take your antibiotics as directed. Finish them even if you start to feel better.   For the next few days, avoid physical activities that bring on chest pain. Continue physical activities as directed.   Do not smoke.   Avoid drinking alcohol.   Only take over-the-counter or prescription medicine for pain, discomfort, or fever as directed by your caregiver.   Follow your caregiver's suggestions for further testing if your chest pain does not go away.   Keep any follow-up appointments you made. If you do not go to an appointment, you could develop lasting (chronic) problems with pain. If there is any problem keeping an appointment, you must call to reschedule.  SEEK MEDICAL CARE IF:   You think you are having problems from the medicine you are taking. Read your medicine instructions carefully.   Your chest pain does not go away, even after treatment.   You develop a rash with blisters on your chest.  SEEK IMMEDIATE MEDICAL CARE IF:   You have increased chest pain or pain that spreads to your arm, neck, jaw, back, or abdomen.   You develop shortness of breath, an increasing cough, or you are coughing up blood.   You have severe back or abdominal pain, feel nauseous, or vomit.   You develop severe weakness, fainting, or chills.  You have a fever.  THIS IS AN EMERGENCY. Do not wait to see if the pain will go away. Get medical help at once. Call your local emergency services (911 in [place] Do not drive yourself to the hospital. MAKE SURE YOU:   Understand these instructions.   Will watch your condition.   Will get help right away if you are not doing well or get worse.  Document Released: 12/02/2004 Document Revised: 02/11/2011 Document Reviewed: 09/28/2007 [facility] Patient Information 2012 [place],  [place].   RESOURCE GUIDE  Dental Problems  Patients with Medicaid: [facility]                     [phone] [address]                                           Phone:  [phone]                                                  If unable to pay or uninsured, contact:  [facility] or [facility]. to become qualified for the adult dental clinic.  Chronic Pain Problems Contact [facility]  [phone] Patients need to be referred by their primary care doctor.  Insufficient Money for Medicine Contact [employer] Way:  call "211" or [facility] [phone].  No Primary Care Doctor Call [facility]  [phone] Other agencies that provide inexpensive medical care    [facility]  [phone]    [facility]  [phone]    [facility]  [phone]    [facility]  [phone]    [facility]  [phone]    [facility]  [phone]  Substance Abuse Resources [facility]  [phone] [facility] [phone] [facility] [place] [facility] [phone] Floydene Flock [phone] Residential & Outpatient Substance Abuse Program  2798307254  Psychological Services [facility]  [phone] [facility]  [phone] [facility]   [phone] (emergency services [phone])  Abuse/Neglect [facility] Child Abuse Hotline 707-103-1592 [facility] Child Abuse Hotline 681-411-2427 (After Hours)  Emergency Shelter [facility] (636)663-5887  [facility] [phone] [facility] [phone]  MRSA Hotline #:   606 104 2801    [facility] Resources  [facility]  [employer]                           [facility] Dept. [address] [place]                     [address]         [address]  Patrecia Pace  [facility] Phone:  [phone]                                  Phone:  [phone]                   Phone:  [phone]  [facility] Phone:  [phone]  [facility] Child Abuse Hotline [phone] 8726861949 (After Hours)

## 2011-07-13 NOTE — ED Notes (Signed)
Pt's O2 can be removed per Greta Doom, PA.

## 2014-09-04 ENCOUNTER — Encounter (HOSPITAL_COMMUNITY): Payer: Self-pay | Admitting: *Deleted

## 2014-09-04 ENCOUNTER — Emergency Department (HOSPITAL_COMMUNITY): Payer: BLUE CROSS/BLUE SHIELD

## 2014-09-04 ENCOUNTER — Emergency Department (HOSPITAL_COMMUNITY)
Admission: EM | Admit: 2014-09-04 | Discharge: 2014-09-04 | Disposition: A | Discharge status: Home / Self Care | Payer: BLUE CROSS/BLUE SHIELD | Attending: Emergency Medicine | Admitting: Emergency Medicine

## 2014-09-04 DIAGNOSIS — E669 Obesity, unspecified: Secondary | ICD-10-CM | POA: Diagnosis not present

## 2014-09-04 DIAGNOSIS — I10 Essential (primary) hypertension: Secondary | ICD-10-CM | POA: Diagnosis not present

## 2014-09-04 DIAGNOSIS — Y998 Other external cause status: Secondary | ICD-10-CM | POA: Insufficient documentation

## 2014-09-04 DIAGNOSIS — W108XXA Fall (on) (from) other stairs and steps, initial encounter: Secondary | ICD-10-CM | POA: Diagnosis not present

## 2014-09-04 DIAGNOSIS — S99922A Unspecified injury of left foot, initial encounter: Secondary | ICD-10-CM | POA: Diagnosis present

## 2014-09-04 DIAGNOSIS — Y9389 Activity, other specified: Secondary | ICD-10-CM | POA: Insufficient documentation

## 2014-09-04 DIAGNOSIS — S93105A Unspecified dislocation of left toe(s), initial encounter: Secondary | ICD-10-CM

## 2014-09-04 DIAGNOSIS — W19XXXA Unspecified fall, initial encounter: Secondary | ICD-10-CM

## 2014-09-04 DIAGNOSIS — S99912A Unspecified injury of left ankle, initial encounter: Secondary | ICD-10-CM | POA: Insufficient documentation

## 2014-09-04 DIAGNOSIS — Y9289 Other specified places as the place of occurrence of the external cause: Secondary | ICD-10-CM | POA: Insufficient documentation

## 2014-09-04 DIAGNOSIS — Z8659 Personal history of other mental and behavioral disorders: Secondary | ICD-10-CM | POA: Insufficient documentation

## 2014-09-04 DIAGNOSIS — S93115A Dislocation of interphalangeal joint of left lesser toe(s), initial encounter: Secondary | ICD-10-CM | POA: Insufficient documentation

## 2014-09-04 HISTORY — DX: Attention-deficit hyperactivity disorder, unspecified type: F90.9

## 2014-09-04 MED ORDER — LIDOCAINE HCL (PF) 1 % IJ SOLN
10.0000 mL | Freq: Once | INTRAMUSCULAR | Status: AC
  Administered 2014-09-04: 10 mL
  Filled 2014-09-04: qty 10

## 2014-09-04 MED ORDER — HYDROCODONE-ACETAMINOPHEN 5-325 MG PO TABS
2.0000 | ORAL_TABLET | Freq: Once | ORAL | Status: AC
  Administered 2014-09-04: 2 via ORAL
  Filled 2014-09-04: qty 2

## 2014-09-04 NOTE — ED Notes (Signed)
Heather PA at bedside.  

## 2014-09-04 NOTE — ED Provider Notes (Signed)
CSN: [id number]     Arrival date & time 09/04/14  1438 History   This chart was scribed for Santiago GladHeather Keino Placencia, PA-C working with Gerhard Munchobert Lockwood, MD by Elveria Risingimelie Horne, ED Scribe. This patient was seen in room TR06C/TR06C and the patient's care was started at 2:53 PM.   Chief Complaint  Patient presents with  . Fall  . Foot Pain    The history is provided by the patient. No language interpreter was used.   HPI Comments: Sue Flores is a 39 y.o. female who presents to the Emergency Department of left foot and ankle injury sustained approximately two hours ago after mechanical trip and fall. Patient reports tripping when walking out of her front door and falling down her doorsteps; patient reports falling forward and states she was able to catch herself with outstretched hands. Patient denies striking her head or loss of consciousness. Patient reports pain shooting up her left lower leg at time of injury. Patient noticed deformed left fourth digit following the fall and experiencing sharp pain with attempting to reduce it. Patient ambulatory since incident with difficulty due to pain severity. Patient reports exacerbated pain to dorsum of foot and ankle with movement and pain to toes with movement.  She denies any pain of the wrist, hand, fingers, shoulders, neck, or back.      Past Medical History  Diagnosis Date  . Hypertension   . Obesity   . Obesity   . ADHD (attention deficit hyperactivity disorder)    Past Surgical History  Procedure Laterality Date  . Cesarean section     History reviewed. No pertinent family history. History  Substance Use Topics  . Smoking status: Never Smoker   . Smokeless tobacco: Not on file  . Alcohol Use: No   OB History    No data available     Review of Systems  Constitutional: Negative for fever and chills.  Musculoskeletal: Positive for joint swelling and arthralgias.  Skin:       Abrasion  Neurological: Negative for numbness.       Allergies  Review of patient's allergies indicates no known allergies.  Home Medications   Prior to Admission medications   Medication Sig Start Date End Date Taking? Authorizing Provider  ibuprofen (ADVIL,MOTRIN) 200 MG tablet Take 400-600 mg by mouth every 6 (six) hours as needed. For pain    Historical Provider, MD   Triage Vitals: BP 132/84 mmHg  Pulse 87  Temp(Src) 97.9 F (36.6 C) (Oral)  Resp 18  SpO2 98% Physical Exam  Constitutional: She is oriented to person, place, and time. She appears well-developed and well-nourished. No distress.  HENT:  Head: Normocephalic and atraumatic.  Eyes: EOM are normal.  Neck: Neck supple. No tracheal deviation present.  Cardiovascular: Normal rate, regular rhythm and normal heart sounds.   Pulmonary/Chest: Effort normal and breath sounds normal. No respiratory distress.  Musculoskeletal: Normal range of motion.  Full ROM of both wrists. 2+ DP pulse on left. Swelling and deformity of the left fourth toe. No bony tenderness of ankle. Swelling of left lateral malleolus. Diffuse tenderness to dorsal aspect of foot.   Neurological: She is alert and oriented to person, place, and time.  Skin: Skin is warm and dry.  Psychiatric: She has a normal mood and affect. Her behavior is normal.  Nursing note and vitals reviewed.   ED Course  dislocation reduction Date/Time: 09/04/2014 10:54 PM Performed by: Santiago GladLAISURE, Laura Radilla Authorized by: Santiago GladLAISURE, Dray Dente Consent: Verbal consent obtained. Written  consent not obtained. Risks and benefits: risks, benefits and alternatives were discussed Consent given by: patient Patient understanding: patient states understanding of the procedure being performed Patient consent: the patient's understanding of the procedure matches consent given Procedure consent: procedure consent matches procedure scheduled Imaging studies: imaging studies available Patient identity confirmed: verbally with patient Time  out: Immediately prior to procedure a "time out" was called to verify the correct patient, procedure, equipment, support staff and site/side marked as required. Local anesthesia used: yes Anesthesia: digital block Local anesthetic: lidocaine 2% without epinephrine Anesthetic total: 5 ml Patient sedated: no Patient tolerance: Patient tolerated the procedure well with no immediate complications   (including critical care time)  COORDINATION OF CARE: 2:59 PM- Will obtain imaging of left foot and ankle. Discussed treatment plan with patient at bedside and patient agreed to plan.   Labs Review Labs Reviewed - No data to display  Imaging Review Dg Ankle Complete Left  09/04/2014   CLINICAL DATA:  39 year old female status post fall and foot pain.  EXAM: LEFT ANKLE COMPLETE - 3+ VIEW  COMPARISON:  Wrist radiograph dated 09/04/2014  FINDINGS: There is no evidence of fracture, dislocation, or joint effusion. No evidence of arthropathy or other focal bone abnormality. Mild soft tissue swelling of the foot and ankle. A 6 mm calcaneal spur noted.  IMPRESSION: No fracture or dislocation of the ankle or hindfoot.   Electronically Signed   By: Elgie Collard M.D.   On: 09/04/2014 15:37   Dg Foot Complete Left  09/04/2014   CLINICAL DATA:  New 39 year old female status post fall and deformity of the fourth toe.  EXAM: LEFT FOOT - COMPLETE 3+ VIEW  COMPARISON:  Ankle x-ray dated 09/04/2014  FINDINGS: There is approximately 3 mm lateral dislocation of the middle and distal phalanges of the fourth digit at the proximal interphalangeal joint. No definite fracture or bony fragment identified. There is soft tissue swelling of the forefoot. A 6 mm calcaneal spur noted.  IMPRESSION: Approximately 3 mm lateral dislocation of the middle and distal phalanges of the fourth digit at the PIP. No definite acute fracture.   Electronically Signed   By: Elgie Collard M.D.   On: 09/04/2014 15:32     EKG  Interpretation None      MDM   Final diagnoses:  None  Patient presents today with pain of the 4th toe after a mechanical fall.  Xray showing a dislocation of the 4th digit at the PIP.  No laceration.  No fracture.  Patient neurovascularly intact.  Toe reduced in the ED without difficulty.  Patient stable for discharge.  Return precautions given.  I personally performed the services described in this documentation, which was scribed in my presence. The recorded information has been reviewed and is accurate.    Santiago Glad, PA-C 09/05/14 2249  Gerhard Munch, MD 09/06/14 [phone]

## 2014-09-04 NOTE — ED Notes (Signed)
Pt reports tripping and falling down approx 3 steps this am, no loc. Having pain to left foot and ankle, possible deformity to 4th digit.

## 2014-09-04 NOTE — Discharge Instructions (Signed)
Buddy Taping of Toes °We have taped your toes together to keep them from moving. This is called "buddy taping" since we used a part of your own body to keep the injured part still. We placed soft padding between your toes to keep them from rubbing against each other. Buddy taping will help with healing and to reduce pain. Keep your toes buddy taped together for as long as directed by your caregiver. °HOME CARE INSTRUCTIONS  °· Raise your injured area above the level of your heart while sitting or lying down. Prop it up with pillows. °· An ice pack used every twenty minutes, while awake, for the first one to two days may be helpful. Put ice in a plastic bag and put a towel between the bag and your skin. °· Watch for signs that the taping is too tight. These signs may be: °¨ Numbness of your taped toes. °¨ Coolness of your taped toes. °¨ Color change in the area beyond the tape. °¨ Increased pain. °· If you have any of these signs, loosen or rewrap the tape. If you need to loosen or rewrap the buddy tape, make sure you use the padding again. °SEEK IMMEDIATE MEDICAL CARE IF:  °· You have worse pain, swelling, inflammation (soreness), drainage or bleeding after you rewrap the tape. °· Any new problems occur. °MAKE SURE YOU:  °· Understand these instructions. °· Will watch your condition. °· Will get help right away if you are not doing well or get worse. °Document Released: 11/27/2003 Document Revised: 05/17/2011 Document Reviewed: 02/20/2008 °[employer]® Patient Information ©2015 [employer]. This information is not intended to replace advice given to you by your health care provider. Make sure you discuss any questions you have with your health care provider. ° °

## 2014-11-21 ENCOUNTER — Emergency Department (HOSPITAL_COMMUNITY): Payer: BLUE CROSS/BLUE SHIELD

## 2014-11-21 ENCOUNTER — Encounter (HOSPITAL_COMMUNITY): Payer: Self-pay | Admitting: Emergency Medicine

## 2014-11-21 ENCOUNTER — Emergency Department (HOSPITAL_COMMUNITY)
Admission: EM | Admit: 2014-11-21 | Discharge: 2014-11-21 | Disposition: A | Discharge status: Home / Self Care | Payer: BLUE CROSS/BLUE SHIELD | Attending: Emergency Medicine | Admitting: Emergency Medicine

## 2014-11-21 DIAGNOSIS — E669 Obesity, unspecified: Secondary | ICD-10-CM | POA: Diagnosis not present

## 2014-11-21 DIAGNOSIS — Y998 Other external cause status: Secondary | ICD-10-CM | POA: Insufficient documentation

## 2014-11-21 DIAGNOSIS — Y9389 Activity, other specified: Secondary | ICD-10-CM | POA: Insufficient documentation

## 2014-11-21 DIAGNOSIS — Z79899 Other long term (current) drug therapy: Secondary | ICD-10-CM | POA: Insufficient documentation

## 2014-11-21 DIAGNOSIS — Z3202 Encounter for pregnancy test, result negative: Secondary | ICD-10-CM | POA: Insufficient documentation

## 2014-11-21 DIAGNOSIS — I10 Essential (primary) hypertension: Secondary | ICD-10-CM | POA: Diagnosis not present

## 2014-11-21 DIAGNOSIS — Z8659 Personal history of other mental and behavioral disorders: Secondary | ICD-10-CM | POA: Insufficient documentation

## 2014-11-21 DIAGNOSIS — Y92410 Unspecified street and highway as the place of occurrence of the external cause: Secondary | ICD-10-CM | POA: Diagnosis not present

## 2014-11-21 DIAGNOSIS — S79912A Unspecified injury of left hip, initial encounter: Secondary | ICD-10-CM | POA: Insufficient documentation

## 2014-11-21 DIAGNOSIS — M25559 Pain in unspecified hip: Secondary | ICD-10-CM

## 2014-11-21 LAB — CBC WITH DIFFERENTIAL/PLATELET
BASOS PCT: 0 %
Basophils Absolute: 0 10*3/uL (ref 0.0–0.1)
Eosinophils Absolute: 0.1 10*3/uL (ref 0.0–0.7)
Eosinophils Relative: 1 %
HEMATOCRIT: 36.2 % (ref 36.0–46.0)
HEMOGLOBIN: 12.6 g/dL (ref 12.0–15.0)
Lymphocytes Relative: 21 %
Lymphs Abs: 1.3 10*3/uL (ref 0.7–4.0)
MCH: 29.2 pg (ref 26.0–34.0)
MCHC: 34.8 g/dL (ref 30.0–36.0)
MCV: 83.8 fL (ref 78.0–100.0)
MONOS PCT: 5 %
Monocytes Absolute: 0.3 10*3/uL (ref 0.1–1.0)
NEUTROS ABS: 4.6 10*3/uL (ref 1.7–7.7)
NEUTROS PCT: 73 %
Platelets: 223 10*3/uL (ref 150–400)
RBC: 4.32 MIL/uL (ref 3.87–5.11)
RDW: 14 % (ref 11.5–15.5)
WBC: 6.3 10*3/uL (ref 4.0–10.5)

## 2014-11-21 LAB — I-STAT BETA HCG BLOOD, ED (MC, WL, AP ONLY): I-stat hCG, quantitative: <5 m[IU]/mL (ref <5)

## 2014-11-21 LAB — COMPREHENSIVE METABOLIC PANEL
ALK PHOS: 51 U/L (ref 38–126)
ALT: 17 U/L (ref 14–54)
ANION GAP: 7 (ref 5–15)
AST: 18 U/L (ref 15–41)
Albumin: 4.3 g/dL (ref 3.5–5.0)
BUN: 11 mg/dL (ref 6–20)
CALCIUM: 9.4 mg/dL (ref 8.9–10.3)
CHLORIDE: 104 mmol/L (ref 101–111)
CO2: 27 mmol/L (ref 22–32)
Creatinine, Ser: 0.8 mg/dL (ref 0.44–1.00)
GFR calc non Af Amer: >60 mL/min (ref >60)
Glucose, Bld: 99 mg/dL (ref 65–99)
Potassium: 3.2 mmol/L — ABNORMAL LOW (ref 3.5–5.1)
SODIUM: 138 mmol/L (ref 135–145)
Total Bilirubin: 0.7 mg/dL (ref 0.3–1.2)
Total Protein: 6.8 g/dL (ref 6.5–8.1)

## 2014-11-21 MED ORDER — METHOCARBAMOL 500 MG PO TABS: 500.0000 mg | ORAL_TABLET | Freq: Two times a day (BID) | ORAL | Status: DC

## 2014-11-21 MED ORDER — NAPROXEN 500 MG PO TABS: 500.0000 mg | ORAL_TABLET | Freq: Two times a day (BID) | ORAL | Status: DC

## 2014-11-21 MED ORDER — OXYCODONE-ACETAMINOPHEN 5-325 MG PO TABS
2.0000 | ORAL_TABLET | Freq: Once | ORAL | Status: AC
  Administered 2014-11-21: 2 via ORAL
  Filled 2014-11-21: qty 2

## 2014-11-21 MED ORDER — LORAZEPAM 2 MG/ML IJ SOLN
1.0000 mg | Freq: Once | INTRAMUSCULAR | Status: AC
  Administered 2014-11-21: 1 mg via INTRAVENOUS
  Filled 2014-11-21: qty 1
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
## 2015-04-08 ENCOUNTER — Emergency Department (HOSPITAL_COMMUNITY)
Admission: EM | Admit: 2015-04-08 | Discharge: 2015-04-08 | Disposition: A | Discharge status: Home / Self Care | Payer: BLUE CROSS/BLUE SHIELD | Attending: Emergency Medicine | Admitting: Emergency Medicine

## 2015-04-08 ENCOUNTER — Encounter (HOSPITAL_COMMUNITY): Payer: Self-pay | Admitting: Emergency Medicine

## 2015-04-08 DIAGNOSIS — F909 Attention-deficit hyperactivity disorder, unspecified type: Secondary | ICD-10-CM | POA: Insufficient documentation

## 2015-04-08 DIAGNOSIS — E669 Obesity, unspecified: Secondary | ICD-10-CM | POA: Diagnosis not present

## 2015-04-08 DIAGNOSIS — H9203 Otalgia, bilateral: Secondary | ICD-10-CM | POA: Insufficient documentation

## 2015-04-08 DIAGNOSIS — J069 Acute upper respiratory infection, unspecified: Secondary | ICD-10-CM | POA: Diagnosis not present

## 2015-04-08 DIAGNOSIS — Z79899 Other long term (current) drug therapy: Secondary | ICD-10-CM | POA: Insufficient documentation

## 2015-04-08 DIAGNOSIS — K0889 Other specified disorders of teeth and supporting structures: Secondary | ICD-10-CM | POA: Insufficient documentation

## 2015-04-08 DIAGNOSIS — Z791 Long term (current) use of non-steroidal anti-inflammatories (NSAID): Secondary | ICD-10-CM | POA: Insufficient documentation

## 2015-04-08 DIAGNOSIS — J029 Acute pharyngitis, unspecified: Secondary | ICD-10-CM | POA: Diagnosis present

## 2015-04-08 DIAGNOSIS — I10 Essential (primary) hypertension: Secondary | ICD-10-CM | POA: Diagnosis not present

## 2015-04-08 MED ORDER — BENZONATATE 100 MG PO CAPS: 200.0000 mg | ORAL_CAPSULE | Freq: Two times a day (BID) | ORAL | Status: DC | PRN

## 2015-04-08 MED ORDER — LIDOCAINE VISCOUS 2 % MT SOLN
15.0000 mL | Freq: Once | OROMUCOSAL | Status: AC
  Administered 2015-04-08: 15 mL via OROMUCOSAL
  Filled 2015-04-08: qty 15

## 2015-04-08 MED ORDER — OXYMETAZOLINE HCL 0.05 % NA SOLN: 1.0000 | Freq: Two times a day (BID) | NASAL | Status: DC

## 2015-04-08 NOTE — ED Provider Notes (Signed)
History  By signing my name below, I, Sue Flores, attest that this documentation has been prepared under the direction and in the presence of Sue Flores, [place]. Electronically Signed: Karle Flores, ED Scribe. 04/08/2015. 10:28 AM.  Chief Complaint  Patient presents with  . Sore Throat  . Otalgia   The history is provided by the patient and medical records. No language interpreter was used.    HPI Comments:  Sue Flores is a 40 y.o. obese female who presents to the Emergency Department complaining of worsening sore throat that began three days ago. She reports associated generalized body aches and bilateral ear pain that began yesterday. She reports nonproductive dry cough, maxillary sinus pain and rhinorrhea. She reports using cough drops and taking Ibuprofen with some relief of the symptoms. Swallowing and talking increases her pain. She denies alleviating factors. She denies fever, chills, facial/neck swelling, CP, SOB, inability to swallow, drooling, abdominal pain, nausea, vomiting or ear drainage.   Past Medical History  Diagnosis Date  . Hypertension   . Obesity   . Obesity   . ADHD (attention deficit hyperactivity disorder)    Past Surgical History  Procedure Laterality Date  . Cesarean section     No family history on file. Social History  Substance Use Topics  . Smoking status: Never Smoker   . Smokeless tobacco: None  . Alcohol Use: No   OB History    No data available     Review of Systems  Constitutional: Negative for fever and chills.  HENT: Positive for congestion, ear pain, rhinorrhea, sinus pressure and sore throat. Negative for ear discharge, facial swelling and trouble swallowing.   Respiratory: Positive for cough. Negative for shortness of breath.   Cardiovascular: Negative for chest pain.  Gastrointestinal: Negative for nausea, vomiting and abdominal pain.  Musculoskeletal: Positive for myalgias.    Allergies  Pork-derived  products  Home Medications   Prior to Admission medications   Medication Sig Start Date End Date Taking? Authorizing Provider  benzonatate (TESSALON) 100 MG capsule Take 2 capsules (200 mg total) by mouth 2 (two) times daily as needed for cough. 04/08/15   Barrett Henle, PA-C  ibuprofen (ADVIL,MOTRIN) 200 MG tablet Take 400-600 mg by mouth every 6 (six) hours as needed for mild pain or moderate pain. For pain    Historical Provider, MD  lisinopril-hydrochlorothiazide (PRINZIDE,ZESTORETIC) 20-25 MG per tablet Take 1 tablet by mouth daily. 10/26/14   Historical Provider, MD  methocarbamol (ROBAXIN) 500 MG tablet Take 1 tablet (500 mg total) by mouth 2 (two) times daily. 11/21/14   Joycie Peek, PA-C  naproxen (NAPROSYN) 500 MG tablet Take 1 tablet (500 mg total) by mouth 2 (two) times daily. 11/21/14   Joycie Peek, PA-C  oxymetazoline (AFRIN NASAL SPRAY) 0.05 % nasal spray Place 1 spray into both nostrils 2 (two) times daily. 04/08/15   Satira Sark Nadeau, PA-C  VYVANSE 40 MG capsule Take 40 mg by mouth daily. 11/14/14   Historical Provider, MD   Triage Vitals: BP 138/92 mmHg  Pulse 92  Temp(Src) 98.2 F (36.8 C) (Oral)  Resp 18  SpO2 100% Physical Exam  Constitutional: She is oriented to person, place, and time. She appears well-developed and well-nourished.  HENT:  Head: Normocephalic and atraumatic.  Right Ear: Tympanic membrane normal.  Left Ear: Tympanic membrane normal.  Nose: Rhinorrhea present. Right sinus exhibits maxillary sinus tenderness. Left sinus exhibits maxillary sinus tenderness.  Mouth/Throat: Uvula is midline, oropharynx is clear and moist and  mucous membranes are normal. No trismus in the jaw. Abnormal dentition. No oropharyngeal exudate, posterior oropharyngeal edema, posterior oropharyngeal erythema or tonsillar abscesses.  Eyes: Conjunctivae and EOM are normal. Right eye exhibits no discharge. Left eye exhibits no discharge. No scleral icterus.   Neck: Normal range of motion. Neck supple.  Cardiovascular: Normal rate, regular rhythm, normal heart sounds and intact distal pulses.  Exam reveals no gallop and no friction rub.   No murmur heard. Pulmonary/Chest: Effort normal and breath sounds normal. No respiratory distress. She has no wheezes. She has no rales. She exhibits no tenderness.  Abdominal: Soft. She exhibits no distension.  Musculoskeletal: Normal range of motion.  Lymphadenopathy:    She has cervical adenopathy (submandibular).  Neurological: She is alert and oriented to person, place, and time.  Skin: Skin is warm and dry.  Psychiatric: She has a normal mood and affect. Her behavior is normal.  Nursing note and vitals reviewed.   ED Course  Procedures (including critical care time) DIAGNOSTIC STUDIES: Oxygen Saturation is 100% on RA, normal by my interpretation.   COORDINATION OF CARE: 10:24 AM- Informed pt her symptoms are likely viral in nature. Will prescribe cough medication and decongestant. Will order viscous Lidocaine prior to discharge. Pt verbalizes understanding and agrees to plan.  Medications  lidocaine (XYLOCAINE) 2 % viscous mouth solution 15 mL (15 mLs Mouth/Throat Given 04/08/15 1033)    Labs Review Labs Reviewed - No data to display  Filed Vitals:   04/08/15 1012  BP: 138/92  Pulse: 92  Temp: 98.2 F (36.8 C)  Resp: 18     MDM   Final diagnoses:  URI (upper respiratory infection)    Pt presents with sore throat, rhinorrhea, sinus pressure, ear pain, nonproductive cough. VSS. Exam revealed cervical lymphadenopathy, maxillary sinus tenderness and rhinorrhea. Lungs CTAB. Centor Score 1, I do not suspect strep pharyngitis. Symptoms consistent with viral URI, I do not feel that any further workup or imaging is warranted at this time. Pt will be discharged with symptomatic treatment.  Discussed return precautions.    Evaluation does not show pathology requring ongoing emergent intervention  or admission. Pt is hemodynamically stable and mentating appropriately. Discussed findings/results and plan with patient/guardian, who agrees with plan. All questions answered. Return precautions discussed and outpatient follow up given.    I personally performed the services described in this documentation, which was scribed in my presence. The recorded information has been reviewed and is accurate.    Satira Sark [place], [place] 04/08/15 1055  Tilden Fossa, MD 04/09/15 [phone]

## 2015-04-08 NOTE — Discharge Instructions (Signed)
Take your medications as prescribed. I also recommend using a coolmist humidifier for symptomatic relief. Please follow up with a primary care provider from the Resource Guide provided below in 4-5 days. Please return to the Emergency Department if symptoms worsen or new onset of fever, facial/neck swelling, drooling, unable to swallow fluids, vomiting, chest pain, shortness of breath.   Emergency Department Resource Guide 1) Find a Doctor and Pay Out of Pocket Although you won't have to find out who is covered by your insurance plan, it is a good idea to ask around and get recommendations. You will then need to call the office and see if the doctor you have chosen will accept you as a new patient and what types of options they offer for patients who are self-pay. Some doctors offer discounts or will set up payment plans for their patients who do not have insurance, but you will need to ask so you aren't surprised when you get to your appointment.  2) Contact Your Local Health Department Not all health departments have doctors that can see patients for sick visits, but many do, so it is worth a call to see if yours does. If you don't know where your local health department is, you can check in your phone book. The CDC also has a tool to help you locate your state's health department, and many state websites also have listings of all of their local health departments.  3) Find a Walk-in Clinic If your illness is not likely to be very severe or complicated, you may want to try a walk in clinic. These are popping up all over the country in pharmacies, drugstores, and shopping centers. They're usually staffed by nurse practitioners or physician assistants that have been trained to treat common illnesses and complaints. They're usually fairly quick and inexpensive. However, if you have serious medical issues or chronic medical problems, these are probably not your best option.  No Primary Care  Doctor: - Call Health Connect at  [phone] - they can help you locate a primary care doctor that  accepts your insurance, provides certain services, etc. - Physician Referral Service- [phone]  Chronic Pain Problems: Organization         Address  Phone   Notes  [facility]  [phone] Patients need to be referred by their primary care doctor.   Medication Assistance: Organization         Address  Phone   Notes  [facility] Medication [facility] [address] [place] [place], [place] [address] [phone] --Must be a resident of [facility] -- Must have NO insurance coverage whatsoever (no Medicaid/ Medicare, etc.) -- The pt. MUST have a primary care doctor that directs their care regularly and follows them in the community   [facility]  [phone]   [employer]  [phone]    Agencies that provide inexpensive medical care: Organization         Address  Phone   Notes  [facility]  [phone]   [facility]    [phone]   [facility] [address] [place], [place] [address] [phone]   [facility] [address] [place], [place] [phone]   [facility]anned Parenthood    [phone]   [facility]    [phone]   [facility]  [address], [place] Phone:  [phone],  Fax:  [phone] Hours of Operation:  9 am - 6 pm, M-F.  Also accepts Medicaid/Medicare and self-pay.  [facility] for [place], [address], [place] Phone: [phone], Fax: [phone]. Hours of Operation:  8:30 am - 5:30 pm, M-F.  Also accepts Medicaid and self-pay.  [facility] [place] [address] [place] Phone: [phone]   [place], [place], [place] [phone], Ext. [phone] Mondays & Thursdays: 7-9 AM.  First 15 patients are seen on a first  come, first serve basis.    [place] Providers:  Organization         Address  Phone   Notes  [facility] [address], [place] [phone] Also accepts self-pay patients.  [facility] [address], [place]  [phone]   [place], [address], [place] [phone]   [facility] [address] [place] [phone]   Lucianne Lei [address], [place]   [phone] Only accepts [place] Access [place] patients after they have their name applied to their card.   Self-Pay (no insurance) in [facility]:  Organization         Address  Phone   Notes  Sickle Cell Patients, [facility] Internal Medicine [place] [phone]   [facility] Urgent Care [place] [phone]   [facility] [place]  [place], [place], [place] [phone]   [facility]/Dr. Osei-Bonsu  [address] [place] or [address], [place] [phone] Phone number for both [place] and [place] locations is the same.  [facility] [address] [place] [phone]   [facility] [address], [place] or [address] [phone]   [facility] [address] [place] [phone], phone; [phone], fax Sees patients 1st and 3rd Saturday of every month.  Must not qualify for public or private insurance (i.e. Medicaid, Medicare, [place] Health Choice, Veterans' Benefits)  Household income should be no more than 200% of the poverty level The clinic cannot treat you if you are pregnant or think you are pregnant  Sexually transmitted diseases are not treated at the clinic.    Dental Care: Organization          Address  Phone  Notes  [facility] [place] [phone] Accepts children up to age 36 who are enrolled in [place] or [place]; pregnant women with a Medicaid card; and children who have applied for Medicaid or [place] Health Choice, but were declined, whose parents can pay a reduced fee at time of service.  [facility] of [facility]  [address], [place] [phone] Accepts children up to age 98 who are enrolled in [place] or [place]; pregnant women with a Medicaid card; and children who have applied for Medicaid or [place] Health Choice, but were declined, whose parents can pay a reduced fee at time of service.  [place] Adult [facility] Access PRO[facility]  [place] [phone] Patients are seen by appointment only. Walk-ins are not  accepted. [place] will see patients 13 years of age and older. Monday - Tuesday (8am-5pm) Most Wednesdays (8:30-5pm) $30 per visit, cash only  [facility] Adult Dental Access PROGRAM  [address], [facility] [phone] Patients are seen by appointment only. Walk-ins are not accepted. [place] will see patients 33 years of age and older. One Wednesday Evening (Monthly: Volunteer Based).  $30 per visit, cash only  [place]  [phone] for adults; Children under age 30, call Graduate Pediatric Dentistry at [phone]. Children aged 41-14, please call [phone] to request a pediatric application.  Dental services are provided in all areas of dental care including fillings, crowns and bridges, complete and partial dentures, implants, gum treatment, root canals, and extractions. Preventive care is also provided. Treatment is provided to both adults and children. Patients are selected via a lottery and there is often a waiting list.   [facility] [address] [place]  [phone] [email or web address]   [facility] [address] [place], [place] [phone], Ext. [phone] Second and Fourth Thursday of each month, opens at 6:30 AM; Clinic ends at 9 AM.  Patients are seen on a first-come first-served basis, and a limited number are seen during each clinic.   [facility]  [address] Hillard Danker [place], [place] [phone]   Eligibility Requirements You must have lived in [place], [place], or [place] counties for at least the last three months.   You cannot be eligible for state or federal sponsored [employer], including [employer], [place], or [employer].   You generally cannot be eligible for healthcare insurance through your employer.    How to apply: Eligibility screenings are held every Tuesday and Wednesday afternoon from 1:00 pm until 4:00 pm. You do not need an appointment for the interview!  [facility] [address], [place], [place]   [place]  [employer] Department  [place]  [phone]    Behavioral Health Resources in the Community: Intensive Outpatient Programs Organization         Address  Phone  Notes  [place]. [address], [place], [place] [phone]   [facility] Outpatient [address] [place], [place]   ADS: [facility] [address] [place], [place]   [place] [address],  [place], [place] or [phone]   Substance Abuse Resources Organization         Address  Phone  Notes  Alcohol [facility] Drug Services  [phone]   [place]  [phone]   [facility] [place]   Chinita Pester  [phone]   Residential & Outpatient Substance Abuse Program  [phone]   Psychological  Services Organization         Address  Phone  Notes  [facility]  [place]  [phone]   [place] [address], [place] or [phone]    Mobile Crisis Teams Organization         Address  Phone  Notes  [facility], Mobile Crisis Care Unit  [phone]   [facility]  [address] [place], [place]   [facility] [address] [place] [phone]    Self-Help/Support Groups  Organization         Address  Phone             Notes  [facility] of [place] - variety of support groups  336- [phone] Call for more information  Narcotics Anonymous (NA), [facility] [address], [place]  2 meetings at this location   Statisticianesidential Treatment Programs Organization         Address  Phone  Notes  [facility] [address],    [place]  [phone]   [facility]  [address], [place] [phone], [place], [place] [phone]   [facility] [address], [place] [phone] Admissions: 8am-3pm M-F  [facility] [address]    [place], [place] [phone]   [facility] [address], [place], [place] [phone]   [facility] [address]  [place], [place] [phone]   [facility] - Intensive Outpatient [address], [place], [place] [phone]   [facility] ([facility] [address]  [place], [place] [phone] or [phone]   [facility] (RT[facility]) [address] [place], [place] [phone] Accepts Medicaid  [facility] [address]  [place] [phone] Substance Abuse/Addiction Treatment   [facility] Behavioral Health Resources Organization         Address  Phone  Notes  [facility]  [phone](888)  [phone]   Angie FavaJulie Brannon, PhD [address], [address] [place], [place]   [phone](336) [phone] or [phone]) [phone]   [facility]   [address] [place], [place] [phone]) [phone]   [facility] [address], [place], [place] [phone](336) [phone] Insurance/Medicaid/sponsorship through [facility]  [facility] Families [address]                                    [place], [place] [phone](336) [phone] Therapy/tele-psych/case  [facility] [address].   [place], [place] [phone](336) [phone]    Dr. Lolly MustacheArfeen  [phone]) [phone]   [facility]  [facility] 1) [address],  2) [address], [place] 3)  [address], [place] [phone]) [phone]) [phone]  [phone](336) [phone]   [facility] Child Abuse Hotline [phone](336) [phone] or [phone](336) [phone] (After Hours)

## 2015-04-08 NOTE — ED Notes (Signed)
Started 4 days ago with sore throat. Started yesterday with bilateral ear pain. Denies N/V.

## 2016-12-27 ENCOUNTER — Emergency Department (HOSPITAL_COMMUNITY)
Admission: EM | Admit: 2016-12-27 | Discharge: 2016-12-27 | Disposition: A | Discharge status: Home / Self Care | Payer: 59 | Attending: Emergency Medicine | Admitting: Emergency Medicine

## 2016-12-27 ENCOUNTER — Encounter (HOSPITAL_COMMUNITY): Payer: Self-pay | Admitting: Emergency Medicine

## 2016-12-27 DIAGNOSIS — M545 Low back pain, unspecified: Secondary | ICD-10-CM

## 2016-12-27 DIAGNOSIS — I10 Essential (primary) hypertension: Secondary | ICD-10-CM | POA: Insufficient documentation

## 2016-12-27 DIAGNOSIS — W19XXXA Unspecified fall, initial encounter: Secondary | ICD-10-CM

## 2016-12-27 MED ORDER — HYDROCODONE-ACETAMINOPHEN 5-325 MG PO TABS: 1.0000 | ORAL_TABLET | Freq: Four times a day (QID) | ORAL | 0 refills | Status: DC | PRN

## 2016-12-27 NOTE — ED Notes (Signed)
ED Provider at bedside. 
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
## 2016-12-27 NOTE — Discharge Instructions (Signed)
Use ice on the sore areas 3 times a day for 2 days, after that use heat.  Use Ibuprofen 400mg  3 times a day as needed for pain. Use the narcotic medicine, only if needed and not when driving.  See your doctor for problems.
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
## 2018-03-23 ENCOUNTER — Emergency Department (HOSPITAL_COMMUNITY)
Admission: EM | Admit: 2018-03-23 | Discharge: 2018-03-23 | Disposition: A | Discharge status: Home / Self Care | Payer: 59 | Attending: Emergency Medicine | Admitting: Emergency Medicine

## 2018-03-23 ENCOUNTER — Encounter (HOSPITAL_COMMUNITY): Payer: Self-pay

## 2018-03-23 ENCOUNTER — Other Ambulatory Visit: Payer: Self-pay

## 2018-03-23 DIAGNOSIS — Z79899 Other long term (current) drug therapy: Secondary | ICD-10-CM | POA: Insufficient documentation

## 2018-03-23 DIAGNOSIS — H5712 Ocular pain, left eye: Secondary | ICD-10-CM

## 2018-03-23 DIAGNOSIS — I10 Essential (primary) hypertension: Secondary | ICD-10-CM | POA: Insufficient documentation

## 2018-03-23 MED ORDER — TETRACAINE HCL 0.5 % OP SOLN
2.0000 [drp] | Freq: Once | OPHTHALMIC | Status: AC
  Administered 2018-03-23: 2 [drp] via OPHTHALMIC
  Filled 2018-03-23: qty 4

## 2018-03-23 MED ORDER — FLUORESCEIN SODIUM 1 MG OP STRP
1.0000 | ORAL_STRIP | Freq: Once | OPHTHALMIC | Status: AC
  Administered 2018-03-23: 1 via OPHTHALMIC
  Filled 2018-03-23: qty 1

## 2018-03-23 MED ORDER — OFLOXACIN 0.3 % OP SOLN: 2.0000 [drp] | Freq: Four times a day (QID) | OPHTHALMIC | 0 refills | Status: AC

## 2018-03-23 MED ORDER — IBUPROFEN 400 MG PO TABS
600.0000 mg | ORAL_TABLET | Freq: Once | ORAL | Status: AC
  Administered 2018-03-23: 600 mg via ORAL
  Filled 2018-03-23: qty 1
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
## 2018-03-23 NOTE — Discharge Instructions (Signed)
You have been seen today for left eye pain. Please read and follow all provided instructions.   1. Medications: antibiotic eye drops, ibuprofen as needed for pain, usual home medications 2. Treatment: rest, drink plenty of fluids 3. Follow Up: Please follow up with your primary doctor in 2 days for discussion of your diagnoses and further evaluation after today's visit; if you do not have a primary care doctor use the resource guide provided to find one; Please return to the ER for any new or worsening symptoms. Please obtain all of your results from medical records or have your doctors office obtain the results - share them with your doctor - you should be seen at your doctors office. Call today to arrange your follow up.   Take medications as prescribed. Please review all of the medicines and only take them if you do not have an allergy to them. Return to the emergency room for worsening condition or new concerning symptoms. Follow up with your regular doctor. If you don't have a regular doctor use one of the numbers below to establish a primary care doctor.  Please be aware that if you are taking birth control pills, taking other prescriptions, ESPECIALLY ANTIBIOTICS may make the birth control ineffective - if this is the case, either do not engage in sexual activity or use alternative methods of birth control such as condoms until you have finished the medicine and your family doctor says it is OK to restart them. If you are on a blood thinner such as COUMADIN, be aware that any other medicine that you take may cause the coumadin to either work too much, or not enough - you should have your coumadin level rechecked in next 7 days if this is the case.  ?  It is also a possibility that you have an allergic reaction to any of the medicines that you have been prescribed - Everybody reacts differently to medications and while MOST people have no trouble with most medicines, you may have a reaction such as  nausea, vomiting, rash, swelling, shortness of breath. If this is the case, please stop taking the medicine immediately and contact your physician.  ?  You should return to the ER if you develop severe or worsening symptoms.   Emergency Department Resource Guide 1) Find a Doctor and Pay Out of Pocket Although you won't have to find out who is covered by your insurance plan, it is a good idea to ask around and get recommendations. You will then need to call the office and see if the doctor you have chosen will accept you as a new patient and what types of options they offer for patients who are self-pay. Some doctors offer discounts or will set up payment plans for their patients who do not have insurance, but you will need to ask so you aren't surprised when you get to your appointment.  2) Contact Your Local Health Department Not all health departments have doctors that can see patients for sick visits, but many do, so it is worth a call to see if yours does. If you don't know where your local health department is, you can check in your phone book. The CDC also has a tool to help you locate your state's health department, and many state websites also have listings of all of their local health departments.  3) Find a Walk-in Clinic If your illness is not likely to be very severe or complicated, you may want to try a  walk in clinic. These are popping up all over the country in pharmacies, drugstores, and shopping centers. They're usually staffed by nurse practitioners or physician assistants that have been trained to treat common illnesses and complaints. They're usually fairly quick and inexpensive. However, if you have serious medical issues or chronic medical problems, these are probably not your best option.  No Primary Care Doctor: Call Health Connect at  [phone] - they can help you locate a primary care doctor that  accepts your insurance, provides certain services, etc. Physician Referral  [phone]  Emergency Department Resource Guide 1) Find a Doctor and Pay Out of Pocket Although you won't have to find out who is covered by your insurance plan, it is a good idea to ask around and get recommendations. You will then need to call the office and see if the doctor you have chosen will accept you as a new patient and what types of options they offer for patients who are self-pay. Some doctors offer discounts or will set up payment plans for their patients who do not have insurance, but you will need to ask so you aren't surprised when you get to your appointment.  2) Contact Your Local Health Department Not all health departments have doctors that can see patients for sick visits, but many do, so it is worth a call to see if yours does. If you don't know where your local health department is, you can check in your phone book. The CDC also has a tool to help you locate your state's health department, and many state websites also have listings of all of their local health departments.  3) Find a [facility] If your illness is not likely to be very severe or complicated, you may want to try a walk in clinic. These are popping up all over the country in pharmacies, drugstores, and shopping centers. They're usually staffed by nurse practitioners or physician assistants that have been trained to treat common illnesses and complaints. They're usually fairly quick and inexpensive. However, if you have serious medical issues or chronic medical problems, these are probably not your best option.  No Primary Care Doctor: Call Health Connect at  [phone] - they can help you locate a primary care doctor that  accepts your insurance, provides certain services, etc. Physician Referral Service- [phone]  Chronic Pain Problems: Organization         Address  Phone   Notes  [facility]  [phone] Patients need to be referred by their primary care doctor.    Medication Assistance: Organization         Address  Phone   Notes  [facility] Medication [facility] [address] [place], [place] [address] [phone] --Must be a resident of [facility] -- Must have NO insurance coverage whatsoever (no Medicaid/ Medicare, etc.) -- The pt. MUST have a primary care doctor that directs their care regularly and follows them in the community   [facility]  [phone]   [employer]  [phone]    Agencies that provide inexpensive medical care: Organization         Address  Phone   Notes  [place]  [phone]   [facility]    [phone]   [facility] [place], [place] [address] [phone]   [place] [address] [place] [phone]   [facility]    [phone]   [facility]    [phone]   [facility] [place] [address], [place] Phone:  [phone], Fax:  [phone] Hours of Operation:  9 am - 6 pm, M-F.  Also accepts Medicaid/Medicare and self-pay.  [facility] [place] [address], [address], [place] Phone: [phone], Fax: [phone]. Hours of Operation:  8:30 am - 5:30 pm, M-F.  Also accepts Medicaid and self-pay.  [facility] [place] [address] [place] Phone: [phone]   [place], [place], [place] [phone], Ext. [phone] Mondays & Thursdays: 7-9 AM.  First 15 patients are seen on a first come, first serve basis.    [place] Providers:  Organization         Address  Phone   Notes  [facility] [address], [place] [phone] Also accepts self-pay patients.  [facility] [address], [place]  [phone]   [place], [address], [place] [phone]   [facility] [address], [place] [phone]   Lucianne Lei [address], [place]   [phone] Only accepts [place] [facility] [place] patients after they have their name applied to their card.   Self-Pay (no insurance) in [facility]:  Organization         Address  Phone   Notes  Sickle Cell Patients, [facility] [place] [phone]   [facility] [place] [phone]   [facility] [place]  [place], [place], [place] [phone]   [facility]/Dr. Osei-Bonsu  [address] [place] or [address], [place] [phone] Phone number for both [place] and [place] locations is the same.  [facility] [address] [place] [phone]   [facility] [address], [place] or [address] [phone]   [facility] [address] [place] [phone], phone; [phone], fax Sees patients 1st and 3rd Saturday of every month.  Must not qualify for public or private insurance (i.e. Medicaid, Medicare, [place] [employer] Choice, Veterans' Benefits)  Household income should be no more than 200% of the poverty level The clinic cannot treat you if you are pregnant or think you are pregnant  Sexually transmitted diseases are not treated at the clinic.
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
## 2021-04-30 ENCOUNTER — Ambulatory Visit: Payer: Self-pay

## 2021-04-30 ENCOUNTER — Ambulatory Visit (INDEPENDENT_AMBULATORY_CARE_PROVIDER_SITE_OTHER): Payer: 59 | Admitting: Orthopaedic Surgery

## 2021-04-30 VITALS — Ht 63.25 in | Wt 240.4 lb

## 2021-04-30 DIAGNOSIS — M25552 Pain in left hip: Secondary | ICD-10-CM

## 2021-04-30 DIAGNOSIS — M1612 Unilateral primary osteoarthritis, left hip: Secondary | ICD-10-CM | POA: Diagnosis not present
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
## 2021-05-28 ENCOUNTER — Emergency Department (HOSPITAL_COMMUNITY)
Admission: EM | Admit: 2021-05-28 | Discharge: 2021-05-28 | Disposition: A | Discharge status: Home / Self Care | Payer: 59 | Attending: Emergency Medicine | Admitting: Emergency Medicine

## 2021-05-28 ENCOUNTER — Emergency Department (HOSPITAL_COMMUNITY): Payer: 59

## 2021-05-28 ENCOUNTER — Other Ambulatory Visit: Payer: Self-pay

## 2021-05-28 DIAGNOSIS — S79912A Unspecified injury of left hip, initial encounter: Secondary | ICD-10-CM | POA: Insufficient documentation

## 2021-05-28 DIAGNOSIS — S0990XA Unspecified injury of head, initial encounter: Secondary | ICD-10-CM | POA: Diagnosis not present

## 2021-05-28 DIAGNOSIS — Z79899 Other long term (current) drug therapy: Secondary | ICD-10-CM | POA: Insufficient documentation

## 2021-05-28 DIAGNOSIS — I10 Essential (primary) hypertension: Secondary | ICD-10-CM | POA: Insufficient documentation

## 2021-05-28 DIAGNOSIS — S199XXA Unspecified injury of neck, initial encounter: Secondary | ICD-10-CM | POA: Insufficient documentation

## 2021-05-28 DIAGNOSIS — Y92410 Unspecified street and highway as the place of occurrence of the external cause: Secondary | ICD-10-CM | POA: Diagnosis not present

## 2021-05-28 MED ORDER — IBUPROFEN 400 MG PO TABS
600.0000 mg | ORAL_TABLET | Freq: Once | ORAL | Status: AC
  Administered 2021-05-28: 600 mg via ORAL
  Filled 2021-05-28: qty 1
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
## 2021-05-28 NOTE — ED Notes (Signed)
Pt verbalized understanding of d/c instructions, meds and followup care. Denies questions. VSS, no distress noted. W/C to exit with all belongings.  

## 2021-05-28 NOTE — ED Notes (Signed)
Pt complained of left leg pain that radiates from her knee to her hip when attempted to ambulate. Always stated she had some groin pain. Pt could only take two steps. ?
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
## 2021-06-01 ENCOUNTER — Ambulatory Visit: Payer: 59 | Admitting: Orthopaedic Surgery

## 2021-06-01 ENCOUNTER — Encounter: Payer: Self-pay | Admitting: Orthopaedic Surgery

## 2021-06-01 VITALS — Wt 238.0 lb

## 2021-06-01 DIAGNOSIS — M1612 Unilateral primary osteoarthritis, left hip: Secondary | ICD-10-CM | POA: Diagnosis not present

## 2021-06-01 DIAGNOSIS — M25552 Pain in left hip: Secondary | ICD-10-CM

## 2021-06-01 NOTE — Progress Notes (Signed)
The patient is well-known to me.  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  She has had a BMI of over 40 and she has been working on weight loss.  [facility]  She is worked on her caloric intake.  She has lost more weight.  At this point I am comfortable with her proceeding with a hip replacement for her left hip given her continued diligence and weight loss journey. ? ?Her left hip has severe pain with attempts of internal and external rotation.  Her previous x-rays show severe end-stage arthritis of the left hip with complete loss of the joint space and bone-on-bone wear.  She still denies any chest pain or shortness of breath.  There is been no fever, chills, nausea, vomiting.  We once again reviewed her medical history.  She is only 46 years old. ? ?We talked in length in detail about hip replacement surgery.  I described the risks and benefits of the surgery.  She understands these are heightened given her obesity but I am much more comfortable with proceeding with surgery given the fact that she is continuing to lose weight and is very motivated.  I talked about what to expect with intraoperative and postoperative course.  All questions and concerns were answered and addressed.  At this point I am comfortable with working with getting her scheduled for a left total hip arthroplasty.  In the interim she will continue her exercise routine and her weight loss routine. ?

## 2021-06-22 ENCOUNTER — Telehealth: Payer: Self-pay | Admitting: Orthopaedic Surgery

## 2021-06-22 NOTE — Telephone Encounter (Signed)
Received medical records release form,$25.00 cash and disability form from patient /Forwarding to [facility] today ?

## 2021-06-24 ENCOUNTER — Telehealth: Payer: Self-pay | Admitting: Orthopaedic Surgery

## 2021-06-24 NOTE — Telephone Encounter (Signed)
[employer] forms received. To [employer]. 

## 2021-07-02 ENCOUNTER — Other Ambulatory Visit: Payer: Self-pay

## 2021-07-22 ENCOUNTER — Telehealth: Payer: Self-pay | Admitting: Orthopaedic Surgery

## 2021-07-22 NOTE — Telephone Encounter (Signed)
Sue Flores called requesting office notes dates from April 29,2023 to present. Pt also has an upcoming procedure and Sue Flores is needing to know plan of care for pt's benefits to continue. Please call [phone] and fax notes to [phone]. ?

## 2021-07-23 NOTE — Telephone Encounter (Signed)
There are no records within requested dates.

## 2021-07-27 ENCOUNTER — Telehealth: Payer: Self-pay | Admitting: Orthopaedic Surgery

## 2021-07-27 NOTE — Telephone Encounter (Signed)
Please advise 

## 2021-07-27 NOTE — Telephone Encounter (Signed)
Work note placed in chart.

## 2021-07-27 NOTE — Telephone Encounter (Signed)
Called and advised [employer]

## 2021-07-27 NOTE — Telephone Encounter (Signed)
Received call from [place] with [employer] asking for a call back to verify patient need to remain out of work until her surgery 08/11/2021. Alwyn Ren said she will deny the claim if she do not get a call back today. The number to contact Alwyn Ren is [phone]

## 2021-07-29 ENCOUNTER — Other Ambulatory Visit: Payer: Self-pay | Admitting: Physician Assistant

## 2021-07-29 DIAGNOSIS — M1612 Unilateral primary osteoarthritis, left hip: Secondary | ICD-10-CM

## 2021-07-31 NOTE — Pre-Procedure Instructions (Signed)
Surgical Instructions    Your procedure is scheduled on Tuesday 08/11/21.   Report to [facility] Main Entrance "A" at 10:05 A.M., then check in with the Admitting office.  Call this number if you have problems the morning of surgery:  [phone]   If you have any questions prior to your surgery date call [phone]: Open Monday-Friday 8am-4pm    Remember:  Do not eat after midnight the night before your surgery  You may drink clear liquids until 09:05 A.M. the morning of your surgery.   Clear liquids allowed are: Water, Non-Citrus Juices (without pulp), Carbonated Beverages, Clear Tea, Black Coffee ONLY (NO MILK, CREAM OR POWDERED CREAMER of any kind), and Gatorade   Enhanced Recovery after Surgery for Orthopedics Enhanced Recovery after Surgery is a protocol used to improve the stress on your body and your recovery after surgery.  Patient Instructions  The day of surgery (if you do NOT have diabetes):  Drink ONE (1) Pre-Surgery Clear Ensure by 09:05 A.M. the morning of surgery   This drink was given to you during your hospital  pre-op appointment visit. Nothing else to drink after completing the  Pre-Surgery Clear Ensure.         If you have questions, please contact your surgeon's office.     Take these medicines the morning of surgery with A SIP OF WATER:   amLODipine (NORVASC)   Take these medicines if needed:   diazepam (VALIUM)   As of today, STOP taking any Aspirin (unless otherwise instructed by your surgeon) Aleve, Naproxen, Ibuprofen, Motrin, Advil, Goody's, BC's, all herbal medications, fish oil, and all vitamins.           Do not wear jewelry or makeup Do not wear lotions, powders, perfumes/colognes, or deodorant. Do not shave 48 hours prior to surgery.  Men may shave face and neck. Do not bring valuables to the hospital. Do not wear nail polish, gel polish, artificial nails, or any other type of covering on natural nails (fingers and toes) If you have  artificial nails or gel coating that need to be removed by a nail salon, please have this removed prior to surgery. Artificial nails or gel coating may interfere with anesthesia's ability to adequately monitor your vital signs.  [facility] is not responsible for any belongings or valuables. .   Do NOT Smoke (Tobacco/Vaping)  24 hours prior to your procedure  If you use a CPAP at night, you may bring your mask for your overnight stay.   Contacts, glasses, hearing aids, dentures or partials may not be worn into surgery, please bring cases for these belongings   For patients admitted to the hospital, discharge time will be determined by your treatment team.   Patients discharged the day of surgery will not be allowed to drive home, and someone needs to stay with them for 24 hours.   SURGICAL WAITING ROOM VISITATION Patients having surgery or a procedure in a hospital may have two support people. Children under the age of 21 must have an adult with them who is not the patient. They may stay in the waiting area during the procedure and may switch out with other visitors. If the patient needs to stay at the hospital during part of their recovery, the visitor guidelines for inpatient rooms apply.  Please refer to the [facility] website for the visitor guidelines for Inpatients (after your surgery is over and you are in a regular room).       Special  instructions:    Oral Hygiene is also important to reduce your risk of infection.  Remember - BRUSH YOUR TEETH THE MORNING OF SURGERY WITH YOUR REGULAR TOOTHPASTE   [place] [facility]- Preparing For Surgery  Before surgery, you can play an important role. Because skin is not sterile, your skin needs to be as free of germs as possible. You can reduce the number of germs on your skin by washing with CHG (chlorahexidine gluconate) Soap before surgery.  CHG is an antiseptic cleaner which kills germs and bonds with the skin to continue killing germs even  after washing.     Please do not use if you have an allergy to CHG or antibacterial soaps. If your skin becomes reddened/irritated stop using the CHG.  Do not shave (including legs and underarms) for at least 48 hours prior to first CHG shower. It is OK to shave your face.  Please follow these instructions carefully.     Shower the NIGHT BEFORE SURGERY and the MORNING OF SURGERY with CHG Soap.   If you chose to wash your hair, wash your hair first as usual with your normal shampoo. After you shampoo, rinse your hair and body thoroughly to remove the shampoo.  Then [employer] and genitals (private parts) with your normal soap and rinse thoroughly to remove soap.  After that Use CHG Soap as you would any other liquid soap. You can apply CHG directly to the skin and wash gently with a scrungie or a clean washcloth.   Apply the CHG Soap to your body ONLY FROM THE NECK DOWN.  Do not use on open wounds or open sores. Avoid contact with your eyes, ears, mouth and genitals (private parts). Wash Face and genitals (private parts)  with your normal soap.   Wash thoroughly, paying special attention to the area where your surgery will be performed.  Thoroughly rinse your body with warm water from the neck down.  DO NOT shower/wash with your normal soap after using and rinsing off the CHG Soap.  Pat yourself dry with a CLEAN TOWEL.  Wear CLEAN PAJAMAS to bed the night before surgery  Place CLEAN SHEETS on your bed the night before your surgery  DO NOT SLEEP WITH PETS.   Day of Surgery:  Take a shower with CHG soap. Wear Clean/Comfortable clothing the morning of surgery Do not apply any deodorants/lotions.   Remember to brush your teeth WITH YOUR REGULAR TOOTHPASTE.    If you received a COVID test during your pre-op visit, it is requested that you wear a mask when out in public, stay away from anyone that may not be feeling well, and notify your surgeon if you develop symptoms. If you have  been in contact with anyone that has tested positive in the last 10 days, please notify your surgeon.    Please read over the following fact sheets that you were given.

## 2021-08-04 ENCOUNTER — Encounter (HOSPITAL_COMMUNITY)
Admission: RE | Admit: 2021-08-04 | Discharge: 2021-08-04 | Disposition: A | Discharge status: Home / Self Care | Payer: 59 | Source: Ambulatory Visit | Attending: Orthopaedic Surgery | Admitting: Orthopaedic Surgery

## 2021-08-04 ENCOUNTER — Other Ambulatory Visit: Payer: Self-pay

## 2021-08-04 ENCOUNTER — Encounter (HOSPITAL_COMMUNITY): Payer: Self-pay

## 2021-08-04 VITALS — BP 150/92 | HR 72 | Temp 97.6°F | Resp 17 | Ht 64.0 in | Wt 232.6 lb

## 2021-08-04 DIAGNOSIS — Z1612 Extended spectrum beta lactamase (ESBL) resistance: Secondary | ICD-10-CM | POA: Diagnosis not present

## 2021-08-04 DIAGNOSIS — I2510 Atherosclerotic heart disease of native coronary artery without angina pectoris: Secondary | ICD-10-CM | POA: Insufficient documentation

## 2021-08-04 DIAGNOSIS — Z01818 Encounter for other preprocedural examination: Secondary | ICD-10-CM | POA: Diagnosis present

## 2021-08-04 DIAGNOSIS — M1612 Unilateral primary osteoarthritis, left hip: Secondary | ICD-10-CM

## 2021-08-04 HISTORY — DX: Pneumonia, unspecified organism: J18.9

## 2021-08-04 HISTORY — DX: Unspecified osteoarthritis, unspecified site: M19.90

## 2021-08-04 LAB — BASIC METABOLIC PANEL
Anion gap: 6 (ref 5–15)
BUN: 13 mg/dL (ref 6–20)
CO2: 26 mmol/L (ref 22–32)
Calcium: 9.1 mg/dL (ref 8.9–10.3)
Chloride: 105 mmol/L (ref 98–111)
Creatinine, Ser: 0.62 mg/dL (ref 0.44–1.00)
GFR, Estimated: >60 mL/min (ref >60)
Glucose, Bld: 115 mg/dL — ABNORMAL HIGH (ref 70–99)
Potassium: 3.6 mmol/L (ref 3.5–5.1)
Sodium: 137 mmol/L (ref 135–145)

## 2021-08-04 LAB — CBC
HCT: 40.7 % (ref 36.0–46.0)
Hemoglobin: 12.8 g/dL (ref 12.0–15.0)
MCH: 27.7 pg (ref 26.0–34.0)
MCHC: 31.4 g/dL (ref 30.0–36.0)
MCV: 88.1 fL (ref 80.0–100.0)
Platelets: 220 10*3/uL (ref 150–400)
RBC: 4.62 MIL/uL (ref 3.87–5.11)
RDW: 14.1 % (ref 11.5–15.5)
WBC: 5.6 10*3/uL (ref 4.0–10.5)
nRBC: 0 % (ref 0.0–0.2)

## 2021-08-04 LAB — TYPE AND SCREEN
ABO/RH(D): A POS
Antibody Screen: NEGATIVE

## 2021-08-04 LAB — SURGICAL PCR SCREEN
MRSA, PCR: POSITIVE — AB
Staphylococcus aureus: POSITIVE — AB

## 2021-08-04 NOTE — Progress Notes (Signed)
Pt MRSA/MSSA positive. LVM with surgical scheduler and IBM'd Dr. Magnus Ivan.   Viviano Simas, RN

## 2021-08-04 NOTE — Progress Notes (Signed)
PCP - Mady Gemma, PA-C Cardiologist - denies  PPM/ICD -n/a   Chest x-ray - n/a EKG - 08/04/21 Stress Test - 2013 ECHO - 2013 Cardiac Cath -denies   Sleep Study - denies CPAP - denies  Blood Thinner Instructions: n/a Aspirin Instructions: n/a  ERAS Protcol -Clear liquids until 0900 DOS PRE-SURGERY Ensure or G2- (1) Ensure provided  COVID TEST- n/a  Anesthesia review: n/a  Patient denies shortness of breath, fever, cough and chest pain at PAT appointment   All instructions explained to the patient, with a verbal understanding of the material. Patient agrees to go over the instructions while at home for a better understanding. Patient also instructed to self quarantine after being tested for COVID-19. The opportunity to ask questions was provided.

## 2021-08-11 ENCOUNTER — Other Ambulatory Visit: Payer: Self-pay

## 2021-08-11 ENCOUNTER — Ambulatory Visit (HOSPITAL_COMMUNITY): Payer: 59 | Admitting: Anesthesiology

## 2021-08-11 ENCOUNTER — Ambulatory Visit (HOSPITAL_COMMUNITY): Payer: 59

## 2021-08-11 ENCOUNTER — Encounter (HOSPITAL_COMMUNITY)
Admission: RE | Disposition: A | Discharge status: Home Health | Payer: Self-pay | Source: Home / Self Care | Attending: Orthopaedic Surgery

## 2021-08-11 ENCOUNTER — Observation Stay (HOSPITAL_COMMUNITY): Payer: 59

## 2021-08-11 ENCOUNTER — Ambulatory Visit (HOSPITAL_BASED_OUTPATIENT_CLINIC_OR_DEPARTMENT_OTHER): Payer: 59 | Admitting: Anesthesiology

## 2021-08-11 ENCOUNTER — Observation Stay (HOSPITAL_COMMUNITY)
Admission: RE | Admit: 2021-08-11 | Discharge: 2021-08-12 | Disposition: A | Discharge status: Home Health | Payer: 59 | Attending: Orthopaedic Surgery | Admitting: Orthopaedic Surgery

## 2021-08-11 DIAGNOSIS — F419 Anxiety disorder, unspecified: Secondary | ICD-10-CM | POA: Diagnosis not present

## 2021-08-11 DIAGNOSIS — M1612 Unilateral primary osteoarthritis, left hip: Secondary | ICD-10-CM

## 2021-08-11 DIAGNOSIS — Z79899 Other long term (current) drug therapy: Secondary | ICD-10-CM | POA: Diagnosis not present

## 2021-08-11 DIAGNOSIS — Z96642 Presence of left artificial hip joint: Secondary | ICD-10-CM

## 2021-08-11 DIAGNOSIS — Z01818 Encounter for other preprocedural examination: Secondary | ICD-10-CM

## 2021-08-11 DIAGNOSIS — I10 Essential (primary) hypertension: Secondary | ICD-10-CM

## 2021-08-11 DIAGNOSIS — M1652 Unilateral post-traumatic osteoarthritis, left hip: Secondary | ICD-10-CM | POA: Diagnosis not present

## 2021-08-11 HISTORY — PX: TOTAL HIP ARTHROPLASTY: SHX124

## 2021-08-11 LAB — POCT PREGNANCY, URINE: Preg Test, Ur: NEGATIVE

## 2021-08-11 LAB — ABO/RH: ABO/RH(D): A POS

## 2021-08-11 SURGERY — ARTHROPLASTY, HIP, TOTAL, ANTERIOR APPROACH
Anesthesia: Spinal | Site: Hip | Laterality: Left

## 2021-08-11 MED ORDER — PROPOFOL 10 MG/ML IV BOLUS
INTRAVENOUS | Status: DC | PRN
  Administered 2021-08-11: 30 mg via INTRAVENOUS
  Administered 2021-08-11: 20 mg via INTRAVENOUS
  Administered 2021-08-11: 30 mg via INTRAVENOUS

## 2021-08-11 MED ORDER — SODIUM CHLORIDE 0.9 % IR SOLN
Status: DC | PRN
  Administered 2021-08-11: 1000 mL

## 2021-08-11 MED ORDER — CEFAZOLIN SODIUM-DEXTROSE 1-4 GM/50ML-% IV SOLN
1.0000 g | Freq: Four times a day (QID) | INTRAVENOUS | Status: AC
  Administered 2021-08-11 (×2): 1 g via INTRAVENOUS
  Filled 2021-08-11 (×2): qty 50

## 2021-08-11 MED ORDER — MENTHOL 3 MG MT LOZG: 1.0000 | LOZENGE | OROMUCOSAL | Status: DC | PRN

## 2021-08-11 MED ORDER — METOCLOPRAMIDE HCL 5 MG/ML IJ SOLN: 5.0000 mg | Freq: Three times a day (TID) | INTRAMUSCULAR | Status: DC | PRN

## 2021-08-11 MED ORDER — OXYCODONE HCL 5 MG PO TABS
ORAL_TABLET | ORAL | Status: AC
  Filled 2021-08-11: qty 2

## 2021-08-11 MED ORDER — MIDAZOLAM HCL 2 MG/2ML IJ SOLN
INTRAMUSCULAR | Status: DC | PRN
  Administered 2021-08-11: 2 mg via INTRAVENOUS

## 2021-08-11 MED ORDER — METOCLOPRAMIDE HCL 5 MG PO TABS: 5.0000 mg | ORAL_TABLET | Freq: Three times a day (TID) | ORAL | Status: DC | PRN

## 2021-08-11 MED ORDER — TIZANIDINE HCL 4 MG PO TABS
4.0000 mg | ORAL_TABLET | Freq: Four times a day (QID) | ORAL | Status: DC | PRN
  Administered 2021-08-11 – 2021-08-12 (×3): 4 mg via ORAL
  Filled 2021-08-11 (×3): qty 1

## 2021-08-11 MED ORDER — AMLODIPINE BESYLATE 5 MG PO TABS
5.0000 mg | ORAL_TABLET | Freq: Every day | ORAL | Status: DC
  Administered 2021-08-12: 5 mg via ORAL
  Filled 2021-08-11: qty 1

## 2021-08-11 MED ORDER — PHENOL 1.4 % MT LIQD: 1.0000 | OROMUCOSAL | Status: DC | PRN

## 2021-08-11 MED ORDER — ONDANSETRON HCL 4 MG/2ML IJ SOLN: 4.0000 mg | Freq: Once | INTRAMUSCULAR | Status: DC | PRN

## 2021-08-11 MED ORDER — CHLORHEXIDINE GLUCONATE 0.12 % MT SOLN
15.0000 mL | Freq: Once | OROMUCOSAL | Status: AC
  Administered 2021-08-11: 15 mL via OROMUCOSAL
  Filled 2021-08-11: qty 15

## 2021-08-11 MED ORDER — POVIDONE-IODINE 10 % EX SWAB
2.0000 "application " | Freq: Once | CUTANEOUS | Status: AC
  Administered 2021-08-11: 2 via TOPICAL

## 2021-08-11 MED ORDER — 0.9 % SODIUM CHLORIDE (POUR BTL) OPTIME
TOPICAL | Status: DC | PRN
  Administered 2021-08-11: 1000 mL

## 2021-08-11 MED ORDER — ONDANSETRON HCL 4 MG PO TABS: 4.0000 mg | ORAL_TABLET | Freq: Four times a day (QID) | ORAL | Status: DC | PRN

## 2021-08-11 MED ORDER — LISINOPRIL 20 MG PO TABS
20.0000 mg | ORAL_TABLET | Freq: Every day | ORAL | Status: DC
  Administered 2021-08-11: 20 mg via ORAL
  Filled 2021-08-11: qty 1

## 2021-08-11 MED ORDER — FENTANYL CITRATE (PF) 100 MCG/2ML IJ SOLN
INTRAMUSCULAR | Status: AC
  Filled 2021-08-11: qty 2

## 2021-08-11 MED ORDER — FENTANYL CITRATE (PF) 100 MCG/2ML IJ SOLN
25.0000 ug | INTRAMUSCULAR | Status: DC | PRN
  Administered 2021-08-11 (×2): 50 ug via INTRAVENOUS

## 2021-08-11 MED ORDER — POLYETHYLENE GLYCOL 3350 17 G PO PACK: 17.0000 g | PACK | Freq: Every day | ORAL | Status: DC | PRN

## 2021-08-11 MED ORDER — OXYCODONE HCL 5 MG PO TABS: 10.0000 mg | ORAL_TABLET | ORAL | Status: DC | PRN

## 2021-08-11 MED ORDER — PHENYLEPHRINE HCL-NACL 20-0.9 MG/250ML-% IV SOLN
INTRAVENOUS | Status: DC | PRN
  Administered 2021-08-11: 30 ug/min via INTRAVENOUS

## 2021-08-11 MED ORDER — HYDROMORPHONE HCL 1 MG/ML IJ SOLN
0.5000 mg | INTRAMUSCULAR | Status: DC | PRN
  Administered 2021-08-11: 1 mg via INTRAVENOUS
  Filled 2021-08-11: qty 1

## 2021-08-11 MED ORDER — RISAQUAD PO CAPS
2.0000 | ORAL_CAPSULE | Freq: Every day | ORAL | Status: DC
  Administered 2021-08-11 – 2021-08-12 (×2): 2 via ORAL
  Filled 2021-08-11 (×2): qty 2

## 2021-08-11 MED ORDER — CEFAZOLIN SODIUM-DEXTROSE 2-4 GM/100ML-% IV SOLN
2.0000 g | INTRAVENOUS | Status: AC
  Administered 2021-08-11: 2 g via INTRAVENOUS
  Filled 2021-08-11: qty 100

## 2021-08-11 MED ORDER — TRANEXAMIC ACID-NACL 1000-0.7 MG/100ML-% IV SOLN
1000.0000 mg | INTRAVENOUS | Status: AC
  Administered 2021-08-11: 1000 mg via INTRAVENOUS
  Filled 2021-08-11: qty 100

## 2021-08-11 MED ORDER — LACTATED RINGERS IV SOLN: INTRAVENOUS | Status: DC

## 2021-08-11 MED ORDER — OXYCODONE HCL 5 MG PO TABS
5.0000 mg | ORAL_TABLET | ORAL | Status: DC | PRN
  Administered 2021-08-11 – 2021-08-12 (×6): 10 mg via ORAL
  Filled 2021-08-11 (×5): qty 2

## 2021-08-11 MED ORDER — PROPOFOL 500 MG/50ML IV EMUL
INTRAVENOUS | Status: DC | PRN
  Administered 2021-08-11: 75 ug/kg/min via INTRAVENOUS

## 2021-08-11 MED ORDER — MIDAZOLAM HCL 2 MG/2ML IJ SOLN
INTRAMUSCULAR | Status: AC
  Filled 2021-08-11: qty 2

## 2021-08-11 MED ORDER — ASPIRIN 81 MG PO CHEW
81.0000 mg | CHEWABLE_TABLET | Freq: Two times a day (BID) | ORAL | Status: DC
  Administered 2021-08-11 – 2021-08-12 (×2): 81 mg via ORAL
  Filled 2021-08-11 (×2): qty 1

## 2021-08-11 MED ORDER — DOCUSATE SODIUM 100 MG PO CAPS
100.0000 mg | ORAL_CAPSULE | Freq: Two times a day (BID) | ORAL | Status: DC
Start: 2021-08-11 — End: 2021-08-12
  Administered 2021-08-11 – 2021-08-12 (×2): 100 mg via ORAL
  Filled 2021-08-11 (×2): qty 1

## 2021-08-11 MED ORDER — ONDANSETRON HCL 4 MG/2ML IJ SOLN: 4.0000 mg | Freq: Four times a day (QID) | INTRAMUSCULAR | Status: DC | PRN

## 2021-08-11 MED ORDER — ORAL CARE MOUTH RINSE: 15.0000 mL | Freq: Once | OROMUCOSAL | Status: AC

## 2021-08-11 MED ORDER — ACETAMINOPHEN 500 MG PO TABS: 1000.0000 mg | ORAL_TABLET | Freq: Once | ORAL | Status: DC

## 2021-08-11 MED ORDER — ACETAMINOPHEN 325 MG PO TABS
325.0000 mg | ORAL_TABLET | Freq: Four times a day (QID) | ORAL | Status: DC | PRN
  Administered 2021-08-11 – 2021-08-12 (×2): 650 mg via ORAL
  Filled 2021-08-11 (×2): qty 2

## 2021-08-11 MED ORDER — ALUM & MAG HYDROXIDE-SIMETH 200-200-20 MG/5ML PO SUSP: 30.0000 mL | ORAL | Status: DC | PRN

## 2021-08-11 MED ORDER — SODIUM CHLORIDE 0.9 % IV SOLN: INTRAVENOUS | Status: DC

## 2021-08-11 MED ORDER — HYDROCHLOROTHIAZIDE 25 MG PO TABS
25.0000 mg | ORAL_TABLET | Freq: Every day | ORAL | Status: DC
  Administered 2021-08-11: 25 mg via ORAL
  Filled 2021-08-11: qty 1

## 2021-08-11 MED ORDER — PANTOPRAZOLE SODIUM 40 MG PO TBEC
40.0000 mg | DELAYED_RELEASE_TABLET | Freq: Every day | ORAL | Status: DC
Start: 2021-08-11 — End: 2021-08-12
  Administered 2021-08-11 – 2021-08-12 (×2): 40 mg via ORAL
  Filled 2021-08-11 (×2): qty 1

## 2021-08-11 MED ORDER — DIPHENHYDRAMINE HCL 12.5 MG/5ML PO ELIX: 12.5000 mg | ORAL_SOLUTION | ORAL | Status: DC | PRN

## 2021-08-11 MED ORDER — BUPIVACAINE IN DEXTROSE 0.75-8.25 % IT SOLN
INTRATHECAL | Status: DC | PRN
  Administered 2021-08-11: 1.8 mL via INTRATHECAL

## 2021-08-11 MED ORDER — LISDEXAMFETAMINE DIMESYLATE 70 MG PO CAPS: 70.0000 mg | ORAL_CAPSULE | Freq: Every day | ORAL | Status: DC

## 2021-08-11 MED ORDER — LISINOPRIL-HYDROCHLOROTHIAZIDE 20-25 MG PO TABS
1.0000 | ORAL_TABLET | Freq: Every day | ORAL | Status: DC
Start: 2021-08-11 — End: 2021-08-11

## 2021-08-11 SURGICAL SUPPLY — 56 items
APL SKNCLS STERI-STRIP NONHPOA (GAUZE/BANDAGES/DRESSINGS) ×1
BAG COUNTER SPONGE SURGICOUNT (BAG) ×3 IMPLANT
BAG SPNG CNTER NS LX DISP (BAG) ×1
BENZOIN TINCTURE PRP APPL 2/3 (GAUZE/BANDAGES/DRESSINGS) ×3 IMPLANT
BLADE CLIPPER SURG (BLADE) IMPLANT
BLADE SAW SGTL 18X1.27X75 (BLADE) ×3 IMPLANT
COVER SURGICAL LIGHT HANDLE (MISCELLANEOUS) ×3 IMPLANT
DRAPE C-ARM 42X72 X-RAY (DRAPES) ×3 IMPLANT
DRAPE STERI IOBAN 125X83 (DRAPES) ×3 IMPLANT
DRAPE U-SHAPE 47X51 STRL (DRAPES) ×9 IMPLANT
DRSG AQUACEL AG ADV 3.5X10 (GAUZE/BANDAGES/DRESSINGS) ×3 IMPLANT
DURAPREP 26ML APPLICATOR (WOUND CARE) ×3 IMPLANT
ELECT BLADE 4.0 EZ CLEAN MEGAD (MISCELLANEOUS) ×2
ELECT BLADE 6.5 EXT (BLADE) IMPLANT
ELECT REM PT RETURN 9FT ADLT (ELECTROSURGICAL) ×2
ELECTRODE BLDE 4.0 EZ CLN MEGD (MISCELLANEOUS) ×2 IMPLANT
ELECTRODE REM PT RTRN 9FT ADLT (ELECTROSURGICAL) ×2 IMPLANT
FACESHIELD WRAPAROUND (MASK) ×4 IMPLANT
FACESHIELD WRAPAROUND OR TEAM (MASK) ×4 IMPLANT
GLOVE BIOGEL PI IND STRL 8 (GLOVE) ×4 IMPLANT
GLOVE BIOGEL PI INDICATOR 8 (GLOVE) ×2
GLOVE ECLIPSE 8.0 STRL XLNG CF (GLOVE) ×3 IMPLANT
GLOVE ORTHO TXT STRL SZ7.5 (GLOVE) ×6 IMPLANT
GOWN STRL REUS W/ TWL LRG LVL3 (GOWN DISPOSABLE) ×4 IMPLANT
GOWN STRL REUS W/ TWL XL LVL3 (GOWN DISPOSABLE) ×4 IMPLANT
GOWN STRL REUS W/TWL LRG LVL3 (GOWN DISPOSABLE) ×4
GOWN STRL REUS W/TWL XL LVL3 (GOWN DISPOSABLE) ×4
HANDPIECE INTERPULSE COAX TIP (DISPOSABLE) ×2
HEAD CERAMIC 36 PLUS 8.5 12 14 (Hips) ×1 IMPLANT
KIT BASIN OR (CUSTOM PROCEDURE TRAY) ×3 IMPLANT
KIT TURNOVER KIT B (KITS) ×3 IMPLANT
LINER NEUTRAL 52X36MM PLUS 4 (Liner) ×1 IMPLANT
MANIFOLD NEPTUNE II (INSTRUMENTS) ×3 IMPLANT
NS IRRIG 1000ML POUR BTL (IV SOLUTION) ×3 IMPLANT
PACK TOTAL JOINT (CUSTOM PROCEDURE TRAY) ×3 IMPLANT
PAD ARMBOARD 7.5X6 YLW CONV (MISCELLANEOUS) ×3 IMPLANT
PIN SECTOR W/GRIP ACE CUP 52MM (Hips) ×1 IMPLANT
SCREW 6.5MMX25MM (Screw) ×1 IMPLANT
SET HNDPC FAN SPRY TIP SCT (DISPOSABLE) ×2 IMPLANT
STAPLER VISISTAT 35W (STAPLE) IMPLANT
STEM FEM ACTIS HIGH SZ3 (Stem) ×1 IMPLANT
STRIP CLOSURE SKIN 1/2X4 (GAUZE/BANDAGES/DRESSINGS) ×6 IMPLANT
SUT ETHIBOND NAB CT1 #1 30IN (SUTURE) ×3 IMPLANT
SUT MNCRL AB 4-0 PS2 18 (SUTURE) IMPLANT
SUT VIC AB 0 CT1 27 (SUTURE) ×2
SUT VIC AB 0 CT1 27XBRD ANBCTR (SUTURE) ×2 IMPLANT
SUT VIC AB 1 CT1 27 (SUTURE) ×2
SUT VIC AB 1 CT1 27XBRD ANBCTR (SUTURE) ×2 IMPLANT
SUT VIC AB 2-0 CT1 27 (SUTURE) ×2
SUT VIC AB 2-0 CT1 TAPERPNT 27 (SUTURE) ×2 IMPLANT
TOWEL GREEN STERILE (TOWEL DISPOSABLE) ×3 IMPLANT
TOWEL GREEN STERILE FF (TOWEL DISPOSABLE) ×3 IMPLANT
TRAY CATH 16FR W/PLASTIC CATH (SET/KITS/TRAYS/PACK) IMPLANT
TRAY FOLEY W/BAG SLVR 16FR (SET/KITS/TRAYS/PACK)
TRAY FOLEY W/BAG SLVR 16FR ST (SET/KITS/TRAYS/PACK) IMPLANT
WATER STERILE IRR 1000ML POUR (IV SOLUTION) ×6 IMPLANT

## 2021-08-11 NOTE — H&P (Signed)
TOTAL HIP ADMISSION H&P  Patient is admitted for left total hip arthroplasty.  Subjective:  Chief Complaint: left hip pain  HPI: Sue Flores, 46 y.o. female, has a history of pain and functional disability in the left hip(s) due to arthritis and patient has failed non-surgical conservative treatments for greater than 12 weeks to include NSAID's and/or analgesics, corticosteriod injections, flexibility and strengthening excercises, use of assistive devices, weight reduction as appropriate, and activity modification.  Onset of symptoms was gradual starting 5 years ago with gradually worsening course since that time.The patient noted no past surgery on the left hip(s).  Patient currently rates pain in the left hip at 10 out of 10 with activity. Patient has night pain, worsening of pain with activity and weight bearing, trendelenberg gait, pain that interfers with activities of daily living, and pain with passive range of motion. Patient has evidence of subchondral cysts, subchondral sclerosis, periarticular osteophytes, and joint space narrowing by imaging studies. This condition presents safety issues increasing the risk of falls.  There is no current active infection.  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   Current Outpatient Medications  Medication Sig Dispense Refill Last Dose   acidophilus (RISAQUAD) CAPS capsule Take 2 capsules by mouth daily.      amLODipine (NORVASC) 5 MG tablet Take 5 mg by mouth daily.      APPLE CIDER VINEGAR PO Take 2 tablets by mouth daily.      diazepam (VALIUM) 5  MG tablet Take 5 mg by mouth 2 (two) times daily as needed for muscle spasms.      ibuprofen (ADVIL) 800 MG tablet Take 800 mg by mouth every 8 (eight) hours as needed for moderate pain.      LINIMENTS EX Apply 1 application. topically daily as needed (hip pain).      lisinopril-hydrochlorothiazide (PRINZIDE,ZESTORETIC) 20-25 MG per tablet Take 1 tablet by mouth daily.  5    VYVANSE 70 MG capsule Take 70 mg by mouth daily.      oxymetazoline (AFRIN NASAL SPRAY) 0.05 % nasal spray Place 1 spray into both nostrils 2 (two) times daily. (Patient not taking: Reported on 05/28/2021) 30 mL 0    Allergies  Allergen Reactions   Methocarbamol     Doesn't feel good when she takes it   Pork-Derived Products     Raises blood pressure    Social History   Tobacco Use   Smoking status: Never   Smokeless tobacco: Never  Substance Use Topics   Alcohol use: Yes    Comment: socially    No family history on file.   Review of Systems  Musculoskeletal:  Positive for gait problem.  All other systems reviewed and are negative.  Objective:  Physical Exam Vitals reviewed.  Constitutional:      Appearance: Normal appearance. She is obese.  HENT:     Head: Normocephalic and atraumatic.  Eyes:     Extraocular Movements: Extraocular movements intact.     Pupils: Pupils are equal, round, and reactive to light.  Cardiovascular:  Rate and Rhythm: Normal rate and regular rhythm.     Pulses: Normal pulses.     Heart sounds: Normal heart sounds.  Pulmonary:     Effort: Pulmonary effort is normal.     Breath sounds: Normal breath sounds.  Abdominal:     Palpations: Abdomen is soft.  Musculoskeletal:     Cervical back: Normal range of motion and neck supple.     Left hip: Tenderness and bony tenderness present. Decreased range of motion. Normal strength.  Neurological:     Mental Status: She is alert and oriented to person, place, and time.  Psychiatric:        Behavior: Behavior normal.     Vital signs in last 24 hours:    Labs:   Estimated body mass index is 39.93 kg/m as calculated from the following:   Height as of 08/04/21: 5\' 4"  (1.626 m).   Weight as of 08/04/21: 105.5 kg.   Imaging Review Plain radiographs demonstrate severe degenerative joint disease of the left hip(s). The bone quality appears to be good for age and reported activity level.      Assessment/Plan:  End stage arthritis, left hip(s)  The patient history, physical examination, clinical judgement of the provider and imaging studies are consistent with end stage degenerative joint disease of the left hip(s) and total hip arthroplasty is deemed medically necessary. The treatment options including medical management, injection therapy, arthroscopy and arthroplasty were discussed at length. The risks and benefits of total hip arthroplasty were presented and reviewed. The risks due to aseptic loosening, infection, stiffness, dislocation/subluxation,  thromboembolic complications and other imponderables were discussed.  The patient acknowledged the explanation, agreed to proceed with the plan and consent was signed. Patient is being admitted for inpatient treatment for surgery, pain control, PT, OT, prophylactic antibiotics, VTE prophylaxis, progressive ambulation and ADL's and discharge planning.The patient is planning to be discharged home with home health services

## 2021-08-11 NOTE — Interval H&P Note (Signed)
History and Physical Interval Note: The patient understands that she is here today for left hip replacement to treat her severe left hip osteoarthritis.  There has been no acute or interval change in her medical status.  The risks and benefits of surgery been explained in detail and informed consent is obtained.  The left operative hip has been marked.  08/11/2021 11:47 AM  Sue Flores  has presented today for surgery, with the diagnosis of OSTEOARTHRITIS / DEGENERATIVE JOINT DISEASE LEFT HIP.  The various methods of treatment have been discussed with the patient and family. After consideration of risks, benefits and other options for treatment, the patient has consented to  Procedure(s): LEFT TOTAL HIP ARTHROPLASTY ANTERIOR APPROACH (Left) as a surgical intervention.  The patient's history has been reviewed, patient examined, no change in status, stable for surgery.  I have reviewed the patient's chart and labs.  Questions were answered to the patient's satisfaction.     Kathryne Hitch

## 2021-08-11 NOTE — Op Note (Signed)
NAMEJAYLEAN, VADNAIS MEDICAL RECORD NO: [id number] ACCOUNT NO: [id number] DATE OF BIRTH: 22-Mar-1975 FACILITY: [facility] LOCATION: [facility] PHYSICIAN: Lind Guest. Ninfa Linden, MD  Operative Report   DATE OF PROCEDURE: 08/11/2021  PREOPERATIVE DIAGNOSIS:  Posttraumatic osteoarthritis and degenerative joint disease, left hip.  POSTOPERATIVE DIAGNOSIS:  Posttraumatic osteoarthritis and degenerative joint disease, left hip.  PROCEDURE:  Left total hip arthroplasty through direct anterior approach.  IMPLANTS:  DePuy sector Gription acetabular component, size 52 with a single screw in the dome, size 36+4 polyethylene liner, size 3 ACTIS femoral component with high offset, size 36+8.5 ceramic hip ball.  SURGEON:  Lind Guest. Ninfa Linden, MD  ASSISTANT:  Erskine Emery, PA-C.  ANESTHESIA:  Spinal.  ANTIBIOTICS:  2 g IV Ancef.  ESTIMATED BLOOD LOSS:  [id number] mL  COMPLICATIONS:  None.  INDICATIONS:  The patient is a 46 year old female who has remote trauma to her left hip and has eventually developed severe posttraumatic arthritis of the left hip with complete loss of the joint space and slight subluxation of the hip.  She walks with a  Trendelenburg gait and her pain is daily with that left hip.  It is detrimentally affecting her mobility, her quality of life and her activities of daily living.  She does have a BMI of 40 and understands this surgery will be extremely difficult.  We  have recommended total hip arthroplasty through direct anterior approach with a heightened risk of acute blood loss anemia, nerve or vessel injury, fracture, infection, dislocation, DVT, implant failure, leg length differences and skin and soft tissue  issues.  Again, these were all heightened given her obesity.  Our goals are to decrease pain, improve mobility and overall improve quality of life.  DESCRIPTION OF PROCEDURE:  After informed consent was obtained, appropriate left hip was marked.  She was brought to the  operating room, sat up on the stretcher where spinal anesthesia was obtained.  A Foley catheter was placed and she was laid supine  while traction boots were placed on both her feet.  Next, she was placed supine on the Hana fracture table, the perineal post in place and both legs in line skeletal traction device and no traction applied.  Her left operative hip was prepped and draped  in DuraPrep and sterile drapes.  A timeout was called and she was identified as correct patient, correct left hip.  We then made an incision just inferior and posterior to the anterior superior iliac spine and carried this obliquely down the leg.  We  dissected down tensor fascia lata muscle.  Tensor fascia was then divided longitudinally to proceed with direct anterior approach to the hip.  We identified and cauterized circumflex vessels and identified the hip capsule, opened the hip capsule in  L-type format finding a moderate joint effusion and significant deformity of the lateral femoral head and neck.  We placed curved retractors around the medial and lateral femoral neck and made a femoral neck cut with an oscillating saw just proximal to  the lesser trochanter and completed this with an osteotome.  We placed a corkscrew guide in the femoral head and removed the femoral head in its entirety and found it to be significantly deformed.  We then removed remnants of acetabular labrum and other  debris from the acetabulum and placed a bent Hohmann over the medial acetabular rim and then began reaming under direct visualization from a small 43 reamer going in a stepwise increments all the way up to a size 51 reamer.  With all reamers placed under  direct visualization, the last reamer was placed under direct fluoroscopy, so we could obtain our depth of reaming, our inclination and anteversion.  I then placed real DePuy sector Gription acetabular component size 52 and a single screw and we went  with a 36+4 polyethylene liner,  having medialized her.  Attention was then turned to the femur.  With the leg externally rotated to 120 degrees and extended and adducted we were able to place a Mueller retractor medially and Hohman retractor behind the  greater trochanter.  We released lateral joint capsule and used a box cutting osteotome to enter femoral canal and a rongeur to lateralize. We then began broaching using the ACTIS broaching system from a size 0 going to a size 3.  With a size 3 in place,  we trialed a high offset femoral neck and a 36+1.5 hip ball, reduced this in the acetabulum.  We felt like we definitely needed more leg length and tightness.  We dislocated the hip, removed the trial components.  We placed the real high offset ACTIS  femoral component, size 3 and the real 36+8.5 ceramic hip ball and reduced this in the acetabulum and it was definitely tight.  We again increased her leg length and equal to her offset.  We assessed it mechanically and radiographically.  We then  irrigated the soft tissue with normal saline solution.  We closed the joint capsule with interrupted #1 Ethibond suture followed by #1 Vicryl to close the tensor fascia.  0 Vicryl was used to close the deep tissue and 2-0 Vicryl was used to close  subcutaneous tissue.  The skin was closed with staples.  Aquacel dressing was applied.  She was awakened, taken to recovery room in stable condition with all final counts being correct.  No complications noted.  Of note, Benita Stabile, PA-C, assisted during  the entire case from beginning to the end and his assistance was crucial for facilitating every aspect of this case and was medically necessary for retracting soft tissues and helping guide implant placement as well as a layered closure of the wound.   PUS D: 08/11/2021 2:45:27 pm T: 08/11/2021 3:26:00 pm  JOB: [id number]

## 2021-08-11 NOTE — Anesthesia Preprocedure Evaluation (Addendum)
Anesthesia Evaluation  Patient identified by MRN, date of birth, ID band Patient awake    Reviewed: Allergy & Precautions, NPO status , Patient's Chart, lab work & pertinent test results  Airway Mallampati: II  TM Distance: >3 FB Neck ROM: Full    Dental  (+) Teeth Intact, Dental Advisory Given, Chipped,    Pulmonary neg pulmonary ROS,    Pulmonary exam normal breath sounds clear to auscultation       Cardiovascular hypertension, Pt. on medications Normal cardiovascular exam Rhythm:Regular Rate:Normal     Neuro/Psych PSYCHIATRIC DISORDERS Anxiety negative neurological ROS     GI/Hepatic negative GI ROS, Neg liver ROS,   Endo/Other  Morbid obesity  Renal/GU negative Renal ROS     Musculoskeletal  (+) Arthritis ,   Abdominal   Peds  (+) ADHD Hematology negative hematology ROS (+) Plt 220k   Anesthesia Other Findings Day of surgery medications reviewed with the patient.  Reproductive/Obstetrics                            Anesthesia Physical Anesthesia Plan  ASA: 3  Anesthesia Plan: Spinal   Post-op Pain Management: Tylenol PO (pre-op)*   Induction: Intravenous  PONV Risk Score and Plan: 2 and TIVA, Treatment may vary due to age or medical condition and Midazolam  Airway Management Planned: Natural Airway and Nasal Cannula  Additional Equipment:   Intra-op Plan:   Post-operative Plan:   Informed Consent: I have reviewed the patients History and Physical, chart, labs and discussed the procedure including the risks, benefits and alternatives for the proposed anesthesia with the patient or authorized representative who has indicated his/her understanding and acceptance.     Dental advisory given  Plan Discussed with: CRNA, Anesthesiologist and Surgeon  Anesthesia Plan Comments: (Discussed risks and benefits of and differences between spinal and general. Discussed risks of  spinal including headache, backache, failure, bleeding, infection, and nerve damage. Patient consents to spinal. Questions answered. Coagulation studies and platelet count acceptable.)        Anesthesia Quick Evaluation

## 2021-08-11 NOTE — Anesthesia Postprocedure Evaluation (Signed)
Anesthesia Post Note  Patient: Sue Flores  Procedure(s) Performed: LEFT TOTAL HIP ARTHROPLASTY ANTERIOR APPROACH (Left: Hip)     Patient location during evaluation: PACU Anesthesia Type: Spinal Level of consciousness: awake Pain management: pain level controlled Vital Signs Assessment: post-procedure vital signs reviewed and stable Respiratory status: spontaneous breathing Cardiovascular status: stable Postop Assessment: no apparent nausea or vomiting Anesthetic complications: no   No notable events documented.  Last Vitals:  Vitals:   08/11/21 1612 08/11/21 1619  BP:  111/72  Pulse:  65  Resp: 18 18  Temp: (!) 36.1 C   SpO2: 100% 100%    Last Pain:  Vitals:   08/11/21 1612  TempSrc:   PainSc: 2                  Ruvi Fullenwider

## 2021-08-11 NOTE — Brief Op Note (Signed)
08/11/2021  2:47 PM  PATIENT:  Sue Flores  45 y.o. female  PRE-OPERATIVE DIAGNOSIS:  OSTEOARTHRITIS / DEGENERATIVE JOINT DISEASE LEFT HIP  POST-OPERATIVE DIAGNOSIS:  OSTEOARTHRITIS / DEGENERATIVE JOINT   PROCEDURE:  Procedure(s): LEFT TOTAL HIP ARTHROPLASTY ANTERIOR APPROACH (Left)  SURGEON:  Surgeon(s) and Role:    * Kathryne Hitch, MD - Primary  PHYSICIAN ASSISTANT:  Rexene Edison, PA-C   ANESTHESIA:   spinal  EBL:  300 mL   COUNTS:  YES  DICTATION: .Other Dictation: Dictation Number [id number]  PLAN OF CARE: Admit for overnight observation  PATIENT DISPOSITION:  PACU - hemodynamically stable.   Delay start of Pharmacological VTE agent (>24hrs) due to surgical blood loss or risk of bleeding: no

## 2021-08-11 NOTE — Anesthesia Procedure Notes (Signed)
Spinal  Patient location during procedure: OR Start time: 08/11/2021 12:57 PM End time: 08/11/2021 1:00 PM Reason for block: surgical anesthesia Staffing Performed: anesthesiologist  Anesthesiologist: Collene Schlichter, MD Preanesthetic Checklist Completed: patient identified, IV checked, risks and benefits discussed, surgical consent, monitors and equipment checked, pre-op evaluation and timeout performed Spinal Block Patient position: sitting Prep: DuraPrep and site prepped and draped Patient monitoring: continuous pulse ox and blood pressure Approach: midline Location: L3-4 Injection technique: single-shot Needle Needle type: Pencan  Needle gauge: 24 G Assessment Events: CSF return Additional Notes Functioning IV was confirmed and monitors were applied. Sterile prep and drape, including hand hygiene, mask and sterile gloves were used. The patient was positioned and the spine was prepped. The skin was anesthetized with lidocaine.  Free flow of clear CSF was obtained prior to injecting local anesthetic into the CSF.  The spinal needle aspirated freely following injection.  The needle was carefully withdrawn.  The patient tolerated the procedure well. Consent was obtained prior to procedure with all questions answered and concerns addressed. Risks including but not limited to bleeding, infection, nerve damage, paralysis, failed block, inadequate analgesia, allergic reaction, high spinal, itching and headache were discussed and the patient wished to proceed.   Arrie Aran, MD

## 2021-08-11 NOTE — Progress Notes (Signed)
PT Cancellation Note  Patient Details Name: STANLEY HELMUTH MRN: [id number] DOB: August 12, 1975   Cancelled Treatment:    Reason Eval/Treat Not Completed: Other (comment) Pt just getting out of OR and not appropriate for PT. Will follow up as schedule allows.   Cindee Salt, DPT  Acute Rehabilitation Services  Office: [phone]    Lehman Prom 08/11/2021, 3:16 PM

## 2021-08-11 NOTE — Transfer of Care (Signed)
Immediate Anesthesia Transfer of Care Note  Patient: Sue Flores  Procedure(s) Performed: LEFT TOTAL HIP ARTHROPLASTY ANTERIOR APPROACH (Left: Hip)  Patient Location: PACU  Anesthesia Type:MAC combined with regional for post-op pain  Level of Consciousness: awake, alert  and oriented  Airway & Oxygen Therapy: Patient Spontanous Breathing  Post-op Assessment: Report given to RN and Post -op Vital signs reviewed and stable  Post vital signs: Reviewed and stable  Last Vitals:  Vitals Value Taken Time  BP    Temp    Pulse 88 08/11/21 1509  Resp 15 08/11/21 1509  SpO2 100 % 08/11/21 1509  Vitals shown include unvalidated device data.  Last Pain:  Vitals:   08/11/21 1111  TempSrc:   PainSc: 0-No pain         Complications: No notable events documented.

## 2021-08-12 ENCOUNTER — Other Ambulatory Visit: Payer: Self-pay | Admitting: Surgical

## 2021-08-12 ENCOUNTER — Telehealth: Payer: Self-pay

## 2021-08-12 ENCOUNTER — Encounter (HOSPITAL_COMMUNITY): Payer: Self-pay | Admitting: Orthopaedic Surgery

## 2021-08-12 ENCOUNTER — Telehealth: Payer: Self-pay | Admitting: Orthopaedic Surgery

## 2021-08-12 DIAGNOSIS — M1652 Unilateral post-traumatic osteoarthritis, left hip: Secondary | ICD-10-CM | POA: Diagnosis not present

## 2021-08-12 LAB — CBC
HCT: 31.1 % — ABNORMAL LOW (ref 36.0–46.0)
Hemoglobin: 10.1 g/dL — ABNORMAL LOW (ref 12.0–15.0)
MCH: 27.8 pg (ref 26.0–34.0)
MCHC: 32.5 g/dL (ref 30.0–36.0)
MCV: 85.7 fL (ref 80.0–100.0)
Platelets: 185 10*3/uL (ref 150–400)
RBC: 3.63 MIL/uL — ABNORMAL LOW (ref 3.87–5.11)
RDW: 14 % (ref 11.5–15.5)
WBC: 7 10*3/uL (ref 4.0–10.5)
nRBC: 0 % (ref 0.0–0.2)

## 2021-08-12 LAB — BASIC METABOLIC PANEL
Anion gap: 5 (ref 5–15)
BUN: 10 mg/dL (ref 6–20)
CO2: 24 mmol/L (ref 22–32)
Calcium: 8.3 mg/dL — ABNORMAL LOW (ref 8.9–10.3)
Chloride: 103 mmol/L (ref 98–111)
Creatinine, Ser: 0.69 mg/dL (ref 0.44–1.00)
GFR, Estimated: >60 mL/min (ref >60)
Glucose, Bld: 114 mg/dL — ABNORMAL HIGH (ref 70–99)
Potassium: 3.8 mmol/L (ref 3.5–5.1)
Sodium: 132 mmol/L — ABNORMAL LOW (ref 135–145)

## 2021-08-12 MED ORDER — OXYCODONE HCL 5 MG PO TABS: 5.0000 mg | ORAL_TABLET | ORAL | 0 refills | Status: DC | PRN

## 2021-08-12 MED ORDER — TIZANIDINE HCL 4 MG PO TABS: 4.0000 mg | ORAL_TABLET | Freq: Four times a day (QID) | ORAL | 0 refills | Status: AC | PRN

## 2021-08-12 MED ORDER — ASPIRIN 81 MG PO CHEW: 81.0000 mg | CHEWABLE_TABLET | Freq: Two times a day (BID) | ORAL | 0 refills | Status: DC

## 2021-08-12 MED ORDER — OXYCODONE HCL 5 MG PO TABS: 5.0000 mg | ORAL_TABLET | ORAL | 0 refills | Status: AC | PRN

## 2021-08-12 NOTE — TOC Transition Note (Signed)
Transition of Care [facility]) - CM/SW Discharge Note   Patient Details  Name: Sue Flores MRN: [id number] Date of Birth: [birth year]  [facility] CM/SW Contact:  Kermit Balo, RN Phone Number: 08/12/2021, 9:47 AM   Clinical Narrative:    Patient is discharging home with home health services through [facility]. Information on the AVS. Bedside RN to obtain needed DME.  Pt has transportation home.    Final next level of care: Home w Home Health Services Barriers to Discharge: No Barriers Identified   Patient Goals and CMS Choice        Discharge Placement                       Discharge Plan and Services                          HH Arranged: PT HH Agency: [facility] Date HH Agency Contacted: 08/12/21   Representative spoke with at [facility] Agency: Morrie Sheldon  Social Determinants of Health (SDOH) Interventions     Readmission Risk Interventions     View : No data to display.

## 2021-08-12 NOTE — Telephone Encounter (Signed)
Pt is calling back, as she states she called 30 mins ago and a lady told her that she would send the msg back to the provider that [facility] -and a NEW strength needed to be called in . ( There was nothing noted in the system)   I advised her that was incorrect, and that she needed to call to find out who had the medication, as we cant tell at the Drs office, when the pharmacy is out of a medication. She then placed me on hold and called [facility] on [address] they have the medication. Please send to [facility] on [address].

## 2021-08-12 NOTE — Evaluation (Signed)
Physical Therapy Evaluation Patient Details Name: Sue Flores MRN: [id number] DOB: [birth year] Today's Date: 08/12/2021  History of Present Illness  46 y/o female admitted on 08/11/21 following L THA. PMH: HTN, ADHD, obesity  Clinical Impression  Patient admitted following above procedure. Patient presents with L hip weakness, impaired ROM, decreased activity tolerance, impaired balance, and pain. Patient required minA for bed mobility and min guard for transfers and ambulation with RW. During ambulation, patient keeping L foot in inverted position and required cues for heel strike and keeping toes forward. Provided patient with HEP handout and reviewed exercises and frequency. Encouraged continued mobility to reduce soreness and pain and improve mobility. Patient will benefit from skilled PT services during acute stay to address listed deficits.      Recommendations for follow up therapy are one component of a multi-disciplinary discharge planning process, led by the attending physician.  Recommendations may be updated based on patient status, additional functional criteria and insurance authorization.  Follow Up Recommendations Follow physician's recommendations for discharge plan and follow up therapies    Assistance Recommended at Discharge Frequent or constant Supervision/Assistance  Patient can return home with the following  A little help with walking and/or transfers;A little help with bathing/dressing/bathroom;Assistance with cooking/housework;Assist for transportation;Help with stairs or ramp for entrance    Equipment Recommendations Rolling Aragorn Recker (2 wheels);BSC/3in1  Recommendations for Other Services       Functional Status Assessment Patient has had a recent decline in their functional status and demonstrates the ability to make significant improvements in function in a reasonable and predictable amount of time.     Precautions / Restrictions Precautions Precautions:  Fall;Anterior Hip Restrictions Weight Bearing Restrictions: Yes LLE Weight Bearing: Weight bearing as tolerated      Mobility  Bed Mobility Overal bed mobility: Needs Assistance Bed Mobility: Supine to Sit, Sit to Supine     Supine to sit: Min assist Sit to supine: Min assist   General bed mobility comments: minA for trunk elevation to EOB and bringing LEs back into bed    Transfers Overall transfer level: Needs assistance Equipment used: Rolling Montrey Buist (2 wheels) Transfers: Sit to/from Stand Sit to Stand: Min guard           General transfer comment: increased time to come into standing from elevated surface. Cues for hand placement    Ambulation/Gait Ambulation/Gait assistance: Min guard Gait Distance (Feet): 50 Feet Assistive device: Rolling Nevyn Bossman (2 wheels) Gait Pattern/deviations: Step-through pattern, Decreased stride length, Decreased stance time - left Gait velocity: decreased     General Gait Details: min guard for safety. Cues for heel strike on L and keeping toes forward as patient tends to invert foot  Stairs            Wheelchair Mobility    Modified Rankin (Stroke Patients Only)       Balance Overall balance assessment: Needs assistance Sitting-balance support: Feet supported, No upper extremity supported Sitting balance-Leahy Scale: Good     Standing balance support: Bilateral upper extremity supported, Reliant on assistive device for balance Standing balance-Leahy Scale: Poor Standing balance comment: reliant on UE support                             Pertinent Vitals/Pain Pain Assessment Pain Assessment: Faces Faces Pain Scale: Hurts whole lot Pain Location: L hip Pain Descriptors / Indicators: Grimacing, Guarding Pain Intervention(s): Monitored during session, Repositioned, Patient requesting pain meds-RN notified  Home Living Family/patient expects to be discharged to:: Private residence Living Arrangements:  Spouse/significant other;Children Available Help at Discharge: Family Type of Home: House Home Access: Ramped entrance       Home Layout: One level Home Equipment: Shower seat;Wheelchair - manual      Prior Function Prior Level of Function : Independent/Modified Independent                     Hand Dominance        Extremity/Trunk Assessment   Upper Extremity Assessment Upper Extremity Assessment: Overall WFL for tasks assessed    Lower Extremity Assessment Lower Extremity Assessment: LLE deficits/detail LLE Deficits / Details: grossly 2/5 and limited by pain LLE: Unable to fully assess due to pain    Cervical / Trunk Assessment Cervical / Trunk Assessment: Normal  Communication   Communication: No difficulties  Cognition Arousal/Alertness: Awake/alert Behavior During Therapy: WFL for tasks assessed/performed Overall Cognitive Status: Within Functional Limits for tasks assessed                                          General Comments      Exercises Other Exercises Other Exercises: Provided patient with HEP handout and reviewed exercises and frequency   Assessment/Plan    PT Assessment Patient needs continued PT services  PT Problem List Decreased strength;Decreased activity tolerance;Decreased mobility;Decreased balance;Decreased range of motion;Decreased knowledge of precautions;Pain       PT Treatment Interventions DME instruction;Gait training;Functional mobility training;Therapeutic activities;Therapeutic exercise;Balance training;Patient/family education    PT Goals (Current goals can be found in the Care Plan section)  Acute Rehab PT Goals Patient Stated Goal: to go home once pain is down PT Goal Formulation: With patient/family Time For Goal Achievement: 08/26/21 Potential to Achieve Goals: Good    Frequency 7X/week     Co-evaluation               AM-PAC PT "6 Clicks" Mobility  Outcome Measure Help needed  turning from your back to your side while in a flat bed without using bedrails?: A Little Help needed moving from lying on your back to sitting on the side of a flat bed without using bedrails?: A Little Help needed moving to and from a bed to a chair (including a wheelchair)?: A Little Help needed standing up from a chair using your arms (e.g., wheelchair or bedside chair)?: A Little Help needed to walk in hospital room?: A Little Help needed climbing 3-5 steps with a railing? : A Lot 6 Click Score: 17    End of Session   Activity Tolerance: Patient limited by pain Patient left: in bed;with call bell/phone within reach Nurse Communication: Mobility status PT Visit Diagnosis: Unsteadiness on feet (R26.81);Muscle weakness (generalized) (M62.81);Difficulty in walking, not elsewhere classified (R26.2);Pain Pain - Right/Left: Left Pain - part of body: Hip    Time: 0821-0858 PT Time Calculation (min) (ACUTE ONLY): 37 min   Charges:   PT Evaluation $PT Eval Low Complexity: 1 Low PT Treatments $Gait Training: 8-22 mins        Myosha Cuadras A. Dan Humphreys PT, DPT Acute Rehabilitation Services Pager [phone] Office [phone]   Viviann Spare 08/12/2021, 10:52 AM

## 2021-08-12 NOTE — Progress Notes (Signed)
Patient alert and oriented, voiding adequately, skin clean, dry and intact without evidence of skin break down, or symptoms of complications - no redness or edema noted, only slight tenderness at site.  Patient states pain is manageable at time of discharge. Patient has an appointment with MD in 2 weeks 

## 2021-08-12 NOTE — Telephone Encounter (Signed)
[facility] stating [facility] was on back order. Lvm for pt to cb to get different pharmacy to send it to

## 2021-08-12 NOTE — Progress Notes (Signed)
Subjective: 1 Day Post-Op Procedure(s) (LRB): LEFT TOTAL HIP ARTHROPLASTY ANTERIOR APPROACH (Left) Patient reports pain as moderate.    Objective: Vital signs in last 24 hours: Temp:  [97 F (36.1 C)-98.9 F (37.2 C)] 98.9 F (37.2 C) (06/07 0731) Pulse Rate:  [65-91] 73 (06/07 0731) Resp:  [10-20] 16 (06/07 0731) BP: (109-146)/(68-86) 116/72 (06/07 0731) SpO2:  [96 %-100 %] 96 % (06/07 0731) Weight:  [102.5 kg] 102.5 kg (06/06 1025)  Intake/Output from previous day: 06/06 0701 - 06/07 0700 In: 800 [I.V.:800] Out: 900 [Urine:600; Blood:300] Intake/Output this shift: No intake/output data recorded.  Recent Labs    08/12/21 0647  HGB 10.1*   Recent Labs    08/12/21 0647  WBC 7.0  RBC 3.63*  HCT 31.1*  PLT 185   No results for input(s): NA, K, CL, CO2, BUN, CREATININE, GLUCOSE, CALCIUM in the last 72 hours. No results for input(s): LABPT, INR in the last 72 hours.  Sensation intact distally Intact pulses distally Dorsiflexion/Plantar flexion intact Incision: scant drainage   Assessment/Plan: 1 Day Post-Op Procedure(s) (LRB): LEFT TOTAL HIP ARTHROPLASTY ANTERIOR APPROACH (Left) Up with therapy Discharge home with home health      Kathryne Hitch 08/12/2021, 7:54 AM

## 2021-08-12 NOTE — Telephone Encounter (Signed)
See other message pharmacy is completely out of oxy. Needs different pharmacy

## 2021-08-12 NOTE — Telephone Encounter (Signed)
Patient got a call from the pharmacy she just left the hospital and the pharmacy is out of 5mg  Oxycodone and she will need a different dosage put in today.

## 2021-08-12 NOTE — Telephone Encounter (Signed)
Sent in, called her, left VM

## 2021-08-12 NOTE — Discharge Instructions (Signed)

## 2021-08-12 NOTE — Discharge Summary (Signed)
Patient ID: Sue Flores MRN: [id number] DOB/AGE: Jul 21, 1975 46 y.o.  Admit date: 08/11/2021 Discharge date: 08/12/2021  Admission Diagnoses:  Principal Problem:   Unilateral primary osteoarthritis, left hip Active Problems:   Status post total replacement of left hip   Discharge Diagnoses:  Same  Past Medical History:  Diagnosis Date   ADHD (attention deficit hyperactivity disorder)    Arthritis    Hypertension    Obesity    Obesity    Pneumonia     Surgeries: Procedure(s): LEFT TOTAL HIP ARTHROPLASTY ANTERIOR APPROACH on 08/11/2021   Consultants:   Discharged Condition: Improved  Hospital Course: Sue Flores is an 46 y.o. female who was admitted 08/11/2021 for operative treatment ofUnilateral primary osteoarthritis, left hip. Patient has severe unremitting pain that affects sleep, daily activities, and work/hobbies. After pre-op clearance the patient was taken to the operating room on 08/11/2021 and underwent  Procedure(s): LEFT TOTAL HIP ARTHROPLASTY ANTERIOR APPROACH.    Patient was given perioperative antibiotics:  Anti-infectives (From admission, onward)    Start     Dose/Rate Route Frequency Ordered Stop   08/11/21 1900  ceFAZolin (ANCEF) IVPB 1 g/50 mL premix        1 g 100 mL/hr over 30 Minutes Intravenous Every 6 hours 08/11/21 1608 08/12/21 0023   08/11/21 1030  ceFAZolin (ANCEF) IVPB 2g/100 mL premix        2 g 200 mL/hr over 30 Minutes Intravenous On call to O.R. 08/11/21 1026 08/11/21 1313        Patient was given sequential compression devices, early ambulation, and chemoprophylaxis to prevent DVT.  Patient benefited maximally from hospital stay and there were no complications.    Recent vital signs: Patient Vitals for the past 24 hrs:  BP Temp Temp src Pulse Resp SpO2  08/12/21 0731 116/72 98.9 F (37.2 C) Oral 73 16 96 %  08/12/21 0427 130/85 98.7 F (37.1 C) Oral 91 18 100 %  08/12/21 0005 127/68 98.3 F (36.8 C) Oral 77 20 100 %   08/11/21 2019 (!) 146/81 97.7 F (36.5 C) Oral 76 18 100 %  08/11/21 1619 111/72 (!) 97.1 F (36.2 C) Oral 65 18 100 %  08/11/21 1612 -- (!) 97 F (36.1 C) -- -- 18 100 %  08/11/21 1540 109/69 -- -- 65 10 99 %     Recent laboratory studies:  Recent Labs    08/12/21 0647  WBC 7.0  HGB 10.1*  HCT 31.1*  PLT 185  NA 132*  K 3.8  CL 103  CO2 24  BUN 10  CREATININE 0.69  GLUCOSE 114*  CALCIUM 8.3*     Discharge Medications:   Allergies as of 08/12/2021       Reactions   Methocarbamol    Doesn't feel good when she takes it   Pork-derived Products    Raises blood pressure        Medication List     TAKE these medications    acidophilus Caps capsule Take 2 capsules by mouth daily.   amLODipine 5 MG tablet Commonly known as: NORVASC Take 5 mg by mouth daily.   APPLE CIDER VINEGAR PO Take 2 tablets by mouth daily.   aspirin 81 MG chewable tablet Chew 1 tablet (81 mg total) by mouth 2 (two) times daily.   diazepam 5 MG tablet Commonly known as: VALIUM Take 5 mg by mouth 2 (two) times daily as needed for muscle spasms.   ibuprofen 800 MG tablet Commonly  known as: ADVIL Take 800 mg by mouth every 8 (eight) hours as needed for moderate pain.   LINIMENTS EX Apply 1 application. topically daily as needed (hip pain).   lisinopril-hydrochlorothiazide 20-25 MG tablet Commonly known as: ZESTORETIC Take 1 tablet by mouth daily.   oxyCODONE 5 MG immediate release tablet Commonly known as: Oxy IR/ROXICODONE Take 1-2 tablets (5-10 mg total) by mouth every 4 (four) hours as needed for moderate pain (pain score 4-6).   tiZANidine 4 MG tablet Commonly known as: ZANAFLEX Take 1 tablet (4 mg total) by mouth every 6 (six) hours as needed for muscle spasms.   Vyvanse 70 MG capsule Generic drug: lisdexamfetamine Take 70 mg by mouth daily.               Durable Medical Equipment  (From admission, onward)           Start     Ordered   08/11/21 1624   DME 3 n 1  Once        08/11/21 1623   08/11/21 1624  DME Walker rolling  Once       Question Answer Comment  Walker: With 5 Inch Wheels   Patient needs a walker to treat with the following condition Status post total replacement of left hip      08/11/21 1623            Diagnostic Studies: DG Pelvis Portable  Result Date: 08/11/2021 CLINICAL DATA:  Total LEFT hip arthroplasty EXAM: PORTABLE PELVIS 1-2 VIEWS COMPARISON:  None Available. FINDINGS: LEFT total hip arthroplasty. Prosthetic components are well seated. Expected soft tissue changes lateral to the LEFT hip. IMPRESSION: No complication following total hip arthroplasty Electronically Signed   By: Suzy Bouchard M.D.   On: 08/11/2021 15:27   DG C-Arm 1-60 Min-No Report  Result Date: 08/11/2021 Fluoroscopy was utilized by the requesting physician.  No radiographic interpretation.   DG HIP UNILAT WITH PELVIS 1V LEFT  Result Date: 08/11/2021 CLINICAL DATA:  Fluoroscopy for total left hip arthroplasty. EXAM: DG HIP (WITH OR WITHOUT PELVIS) 1V*L* COMPARISON:  Left hip radiographs 05/28/2021 FINDINGS: Images were performed intraoperatively without the presence of a radiologist. The patient is undergoing total left hip arthroplasty. Total fluoroscopy images: 3 Total fluoroscopy time: 33 seconds Total dose: Radiation Exposure Index (as provided by the fluoroscopic device): 5.295 mGy air Kerma Please see intraoperative findings for further detail. IMPRESSION: Intraoperative fluoroscopy for total left hip arthroplasty. Electronically Signed   By: Yvonne Kendall M.D.   On: 08/11/2021 15:16    Disposition: Discharge disposition: 01-Home or [place]     Mcarthur Rossetti, MD Follow up in 2 week(s).   Specialty: Orthopedic Surgery Contact information: [place] [address] [address] [facility].   Why: [facility]: [phone] The  home health agency will contact you for the first home visit.                 Signed: Mcarthur Rossetti 08/12/2021, 3:39 PM

## 2021-08-12 NOTE — Progress Notes (Signed)
Physical Therapy Treatment Patient Details Name: Sue Flores MRN: [id number] DOB: [birth year] Today's Date: 08/12/2021   History of Present Illness 46 y/o female admitted on 08/11/21 following L THA. PMH: HTN, ADHD, obesity    PT Comments    Patient seen for second session to progress mobility. Patient able to slightly increase ambulation distance this session to 7' with supervision and RW. Patient participated in therex at bed level prior to mobilization but complains of increased pain. Encouraged continued mobility and performing HEP handout at home for improved mobility and strengthening. D/c plan remains appropriate.     Recommendations for follow up therapy are one component of a multi-disciplinary discharge planning process, led by the attending physician.  Recommendations may be updated based on patient status, additional functional criteria and insurance authorization.  Follow Up Recommendations  Follow physician's recommendations for discharge plan and follow up therapies     Assistance Recommended at Discharge Frequent or constant Supervision/Assistance  Patient can return home with the following A little help with walking and/or transfers;A little help with bathing/dressing/bathroom;Assistance with cooking/housework;Assist for transportation;Help with stairs or ramp for entrance   Equipment Recommendations  Rolling Jalayna Josten (2 wheels);BSC/3in1    Recommendations for Other Services       Precautions / Restrictions Precautions Precautions: Fall;Anterior Hip Restrictions Weight Bearing Restrictions: Yes LLE Weight Bearing: Weight bearing as tolerated     Mobility  Bed Mobility Overal bed mobility: Needs Assistance Bed Mobility: Supine to Sit, Sit to Supine     Supine to sit: Min guard Sit to supine: Min assist   General bed mobility comments: min guard for safety. Increased time and effort to complete. MinA to bring LEs back into bed    Transfers Overall  transfer level: Needs assistance Equipment used: Rolling Dudley Mages (2 wheels) Transfers: Sit to/from Stand Sit to Stand: Min guard           General transfer comment: increased time to complete but no physical assistance required    Ambulation/Gait Ambulation/Gait assistance: Supervision Gait Distance (Feet): 60 Feet Assistive device: Rolling Ferman Basilio (2 wheels) Gait Pattern/deviations: Step-through pattern, Decreased stride length, Decreased stance time - left Gait velocity: decreased     General Gait Details: supervision for safety. Improved quality of heel strike with minimal inversion.   Stairs             Wheelchair Mobility    Modified Rankin (Stroke Patients Only)       Balance Overall balance assessment: Needs assistance Sitting-balance support: Feet supported, No upper extremity supported Sitting balance-Leahy Scale: Good     Standing balance support: Bilateral upper extremity supported, Reliant on assistive device for balance Standing balance-Leahy Scale: Poor Standing balance comment: reliant on UE support                            Cognition Arousal/Alertness: Awake/alert Behavior During Therapy: WFL for tasks assessed/performed Overall Cognitive Status: Within Functional Limits for tasks assessed                                          Exercises Total Joint Exercises Ankle Circles/Pumps: Both, 10 reps, Supine, AROM Quad Sets: Left, 5 reps, Supine, AROM Heel Slides: Left, 5 reps, Supine, AROM Straight Leg Raises: AAROM, Left, 5 reps, Supine Other Exercises Other Exercises: Provided patient with HEP handout and reviewed exercises and  frequency    General Comments        Pertinent Vitals/Pain Pain Assessment Pain Assessment: Faces Faces Pain Scale: Hurts whole lot Pain Location: L hip Pain Descriptors / Indicators: Grimacing, Guarding Pain Intervention(s): Monitored during session, Repositioned    Home  Living Family/patient expects to be discharged to:: Private residence Living Arrangements: Spouse/significant other;Children Available Help at Discharge: Family Type of Home: House Home Access: Ramped entrance       Home Layout: One level Home Equipment: Shower seat;Wheelchair - manual      Prior Function            PT Goals (current goals can now be found in the care plan section) Acute Rehab PT Goals Patient Stated Goal: to go home once pain is down PT Goal Formulation: With patient/family Time For Goal Achievement: 08/26/21 Potential to Achieve Goals: Good Progress towards PT goals: Progressing toward goals    Frequency    7X/week      PT Plan Current plan remains appropriate    Co-evaluation              AM-PAC PT "6 Clicks" Mobility   Outcome Measure  Help needed turning from your back to your side while in a flat bed without using bedrails?: A Little Help needed moving from lying on your back to sitting on the side of a flat bed without using bedrails?: A Little Help needed moving to and from a bed to a chair (including a wheelchair)?: A Little Help needed standing up from a chair using your arms (e.g., wheelchair or bedside chair)?: A Little Help needed to walk in hospital room?: A Little Help needed climbing 3-5 steps with a railing? : A Lot 6 Click Score: 17    End of Session   Activity Tolerance: Patient limited by pain Patient left: in bed;with call bell/phone within reach Nurse Communication: Mobility status PT Visit Diagnosis: Unsteadiness on feet (R26.81);Muscle weakness (generalized) (M62.81);Difficulty in walking, not elsewhere classified (R26.2);Pain Pain - Right/Left: Left Pain - part of body: Hip     Time: 9702-6378 PT Time Calculation (min) (ACUTE ONLY): 24 min  Charges:  $Gait Training: 8-22 mins $Therapeutic Exercise: 8-22 mins                     Crystina Borrayo A. Dan Humphreys PT, DPT Acute Rehabilitation Services Pager  [phone] Office [phone]    Viviann Spare 08/12/2021, 2:05 PM

## 2021-08-13 ENCOUNTER — Telehealth: Payer: Self-pay | Admitting: Orthopaedic Surgery

## 2021-08-13 ENCOUNTER — Encounter: Payer: 59 | Admitting: Orthopaedic Surgery

## 2021-08-13 NOTE — Telephone Encounter (Signed)
Called and gave verbal ok 

## 2021-08-13 NOTE — Telephone Encounter (Signed)
Creta Levin is the PT working in home with patient. She would like verbal orders for 1wk 9x. Her call back number is [phone]

## 2021-08-19 ENCOUNTER — Other Ambulatory Visit: Payer: Self-pay | Admitting: Orthopaedic Surgery

## 2021-08-24 ENCOUNTER — Ambulatory Visit (INDEPENDENT_AMBULATORY_CARE_PROVIDER_SITE_OTHER): Payer: 59 | Admitting: Orthopaedic Surgery

## 2021-08-24 ENCOUNTER — Encounter: Payer: Self-pay | Admitting: Orthopaedic Surgery

## 2021-08-24 DIAGNOSIS — Z96642 Presence of left artificial hip joint: Secondary | ICD-10-CM

## 2021-08-24 NOTE — Progress Notes (Signed)
The patient comes in today 2 weeks status post a left total hip arthroplasty.  She has been compliant with her baby aspirin.  She is walking without assistance and reports good range of motion and strength in her hip.  The staples been removed and Steri-Strips applied to her left hip incision.  There is a moderate seroma but it did not drain anything.  There is bruising to be expected.  Her calf is soft and there is no swelling in her foot and ankle.  She is someone who does like water aerobics.  I like her to wait at least 2-3 more weeks before submerging underwater.  All question concerns were answered and addressed.  She can stop her aspirin.  I will see her back in 4 weeks to see how she is doing overall but no x-rays are needed.  I am fine with her going back on her diazepam as well.

## 2021-08-27 ENCOUNTER — Other Ambulatory Visit: Payer: Self-pay | Admitting: Orthopaedic Surgery

## 2021-09-14 ENCOUNTER — Encounter: Payer: Self-pay | Admitting: Physician Assistant

## 2021-09-14 ENCOUNTER — Ambulatory Visit (INDEPENDENT_AMBULATORY_CARE_PROVIDER_SITE_OTHER): Payer: Self-pay | Admitting: Physician Assistant

## 2021-09-14 DIAGNOSIS — Z96642 Presence of left artificial hip joint: Secondary | ICD-10-CM

## 2021-09-14 NOTE — Progress Notes (Signed)
HPI: Sue Flores comes in today for incision check.  She is status post left total hip arthroplasty 08/11/2021.  She has had no drainage or signs of infection about the incision.  She is going to the beach on Wednesday and wants to know if she can get in the water.  Overall the hip is doing well.  Physical exam: Left hip surgical incisions well-healed.  There is no signs of infection or wound dehiscence.  No drainage.  Left hip good range of motion without significant pain calf supple nontender dorsiflexion plantarflexion ankle intact.  Impression: Status post left total hip arthroplasty  Plan: She will follow-up with Dr. Raye Sorrow as scheduled.  In regards to getting incision wet at this point time she can get the incision wet pool or ocean.  She will monitor the incision site for any signs of infection or wound breakdown.  Questions were encouraged and answered plan

## 2021-09-21 ENCOUNTER — Encounter: Payer: Self-pay | Admitting: Orthopaedic Surgery

## 2021-09-21 ENCOUNTER — Ambulatory Visit (INDEPENDENT_AMBULATORY_CARE_PROVIDER_SITE_OTHER): Payer: 59 | Admitting: Orthopaedic Surgery

## 2021-09-21 DIAGNOSIS — Z96642 Presence of left artificial hip joint: Secondary | ICD-10-CM

## 2021-09-21 NOTE — Progress Notes (Signed)
The patient is a very pleasant 46 year old female who will be 6 weeks tomorrow status post a left total hip arthroplasty.  She has been having some issues with stamina and recovering from the surgery but she has done great.  She went to the beach the other week for the first time had much less pain than when she was dealing with her severely arthritic hip before surgery.  She walks without assistive ice.  She is doing great otherwise.  Her right hip moves smoothly and fluidly.  Her left operative hip moves much more smoothly than it did before surgery.  It is getting better overall.  Her gait and stride look good.  From my standpoint she can return to half days of work starting August 1.  The half days only for the first 2 weeks and I gave her a note reflecting that.  I would like to see her back in 4 weeks to see how she is doing overall but no x-rays are needed.  She looks good at that visit we probably do not need to see her for 3 to 6 months.  She continues on a weight loss journey.  Prior to surgery she was 238 pounds in March and she is lost already 11 more pounds over a short amount of time with a weight of 226 in the office today.  She continues to be highly motivated.

## 2021-09-29 ENCOUNTER — Telehealth: Payer: Self-pay | Admitting: Orthopaedic Surgery

## 2021-09-29 NOTE — Telephone Encounter (Signed)
[employer] forms received. To [employer]. 

## 2021-10-26 ENCOUNTER — Encounter: Payer: Self-pay | Admitting: Orthopaedic Surgery

## 2021-11-12 ENCOUNTER — Encounter: Payer: Self-pay | Admitting: Physician Assistant

## 2021-11-30 ENCOUNTER — Ambulatory Visit (INDEPENDENT_AMBULATORY_CARE_PROVIDER_SITE_OTHER): Payer: 59 | Admitting: Orthopaedic Surgery

## 2021-11-30 ENCOUNTER — Encounter: Payer: Self-pay | Admitting: Orthopaedic Surgery

## 2021-11-30 DIAGNOSIS — Z96642 Presence of left artificial hip joint: Secondary | ICD-10-CM

## 2021-11-30 NOTE — Progress Notes (Signed)
HPI: [name] returns today due to tenderness about her left hip incision.  She is just over 3 months status post left total hip arthroplasty.  She states since going back to working full duties in September she has felt tired particularly by the end of the week.  She states she just has no energy by Thursday.  She does note that she has had "a head cold".  She has had no fevers or chills.  She has had no injury to the left hip.  States that the knee pain and discomfort she has had in the left hip thigh is different than what she had prior to surgery.  Review of systems: See HPI otherwise negative  Physical exam: General well-developed well-nourished female no acute distress.   Respirations: Unlabored. Left hip good range of motion without pain.  Surgical incisions well-healed no signs of erythema, effusion, edema or seroma.  Left calf is nontender.  Ambulates without any assistive device.  Impression: Status post left total hip arthroplasty 08/15/2018.  Plan: We will see him back in 1 month.  She is doing overall.  She did have recent lab work with her primary care physician.  We will not draw any labs today.  Place her on work from home duty for the next month.  Reevaluate her work status in 1 month.  Questions were encouraged.  Dr. Ninfa Linden and myself.

## 2021-12-28 ENCOUNTER — Encounter: Payer: Self-pay | Admitting: Orthopaedic Surgery

## 2021-12-28 ENCOUNTER — Ambulatory Visit (INDEPENDENT_AMBULATORY_CARE_PROVIDER_SITE_OTHER): Payer: 59 | Admitting: Orthopaedic Surgery

## 2021-12-28 DIAGNOSIS — Z96642 Presence of left artificial hip joint: Secondary | ICD-10-CM | POA: Diagnosis not present

## 2021-12-28 NOTE — Progress Notes (Signed)
The patient is now 4-1/50-month status post a left total hip arthroplasty.  She is only 46 years old but she has significantly severe arthritis of her left hip.  She is doing much better overall but still feeling fatigued.  She still gets some swelling in her foot and ankle as well.  She says she just feels tired overall and her primary care physician is drawing blood from her recently.  From my standpoint her left operative hip moves smoothly and fully and there is no blocks to rotation.  She appears comfortable overall.  She does look good overall.  From my standpoint she can work from home for the next month.  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  I do not need to see her back for 6 months unless there are any issues.  At that visit we will have a standing low AP pelvis and lateral of her left operative hip.
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
## 2022-06-29 ENCOUNTER — Ambulatory Visit: Payer: 59 | Admitting: Orthopaedic Surgery

## 2022-06-30 ENCOUNTER — Ambulatory Visit: Payer: 59 | Admitting: Physician Assistant

## 2022-06-30 ENCOUNTER — Other Ambulatory Visit (INDEPENDENT_AMBULATORY_CARE_PROVIDER_SITE_OTHER): Payer: 59

## 2022-06-30 ENCOUNTER — Encounter: Payer: Self-pay | Admitting: Physician Assistant

## 2022-06-30 DIAGNOSIS — Z96642 Presence of left artificial hip joint: Secondary | ICD-10-CM

## 2022-06-30 DIAGNOSIS — R600 Localized edema: Secondary | ICD-10-CM

## 2022-06-30 NOTE — Progress Notes (Signed)
HPI: [name] returns today status post left total hip arthroplasty 08/11/2021.  She is overall doing well.  She states she still has some discomfort when crossing her legs and feels that the "rod in her femur.  She states she is able to walk and recently went to the zoo and was able to walk the entire zoo with short rest.  She is happy with the results of the left hip.  Physical exam: Left hip good range of motion without pain.  Ambulates without an antalgic gait.  Calves are supple nontender bilaterally.  Slight edema bilateral lower extremities.  Dorsiflexion plantarflexion bilateral ankles intact.  Radiographs: AP pelvis and lateral view left hip: Left hip is well located.  Total arthroplasty components well-seated.  No complicating features.  No acute fractures.  Impression: Status post left total hip arthroplasty Lower extremity edema bilaterally mild  Plan: She will follow-up with [place] as needed in regards to her hip.  She will continue work on range of motion hips.  In regards to the edema recommend compression stockings.  She also ask about getting the sit to stand desk and she finds if she sits for too long she has pain in the hip and also has increased edema in her legs.  Therefore note is given for sit to stand desk.

## 2022-11-24 ENCOUNTER — Telehealth: Payer: Self-pay

## 2022-11-24 ENCOUNTER — Other Ambulatory Visit: Payer: Self-pay

## 2022-11-24 DIAGNOSIS — Z1211 Encounter for screening for malignant neoplasm of colon: Secondary | ICD-10-CM

## 2022-11-24 MED ORDER — NA SULFATE-K SULFATE-MG SULF 17.5-3.13-1.6 GM/177ML PO SOLN: 1.0000 | Freq: Once | ORAL | 0 refills | Status: AC

## 2022-11-24 NOTE — Telephone Encounter (Signed)
Gastroenterology Pre-Procedure Review  Request Date: 01/04/23 Requesting Physician: Dr. Allegra Lai  PATIENT REVIEW QUESTIONS: The patient responded to the following health history questions as indicated:    1. Are you having any GI issues? no 2. Do you have a personal history of Polyps? no 3. Do you have a family history of Colon Cancer or Polyps? no 4. Diabetes Mellitus? no 5. Joint replacements in the past 12 months?yes (hip replacement 08/2021) 6. Major health problems in the past 3 months?no 7. Any artificial heart valves, MVP, or defibrillator?no    MEDICATIONS & ALLERGIES:    Patient reports the following regarding taking any anticoagulation/antiplatelet therapy:   Plavix, Coumadin, Eliquis, Xarelto, Lovenox, Pradaxa, Brilinta, or Effient? no Aspirin? no  Patient confirms/reports the following medications:  Current Outpatient Medications  Medication Sig Dispense Refill   acidophilus (RISAQUAD) CAPS capsule Take 2 capsules by mouth daily.     amLODipine (NORVASC) 5 MG tablet Take 5 mg by mouth daily.     APPLE CIDER VINEGAR PO Take 2 tablets by mouth daily.     [facility] ASPIRIN ADULT LOW DOSE 81 MG chewable tablet CHEW 1 TABLET (81 MG TOTAL) BY MOUTH 2 (TWO) TIMES DAILY. 180 tablet 1   diazepam (VALIUM) 5 MG tablet Take 5 mg by mouth 2 (two) times daily as needed for muscle spasms.     ibuprofen (ADVIL) 800 MG tablet Take 800 mg by mouth every 8 (eight) hours as needed for moderate pain.     LINIMENTS EX Apply 1 application. topically daily as needed (hip pain).     lisinopril-hydrochlorothiazide (PRINZIDE,ZESTORETIC) 20-25 MG per tablet Take 1 tablet by mouth daily.  5   oxyCODONE (OXY IR/ROXICODONE) 5 MG immediate release tablet Take 1-2 tablets (5-10 mg total) by mouth every 4 (four) hours as needed for moderate pain (pain score 4-6). 30 tablet 0   tiZANidine (ZANAFLEX) 4 MG tablet Take 1 tablet (4 mg total) by mouth every 6 (six) hours as needed for muscle spasms. 30 tablet 0    VYVANSE 70 MG capsule Take 70 mg by mouth daily.     No current facility-administered medications for this visit.    Patient confirms/reports the following allergies:  Allergies  Allergen Reactions   Methocarbamol     Doesn't feel good when she takes it   Pork-Derived Products     Raises blood pressure    No orders of the defined types were placed in this encounter.   AUTHORIZATION INFORMATION Primary Insurance: 1D#: Group #:  Secondary Insurance: 1D#: Group #:  SCHEDULE INFORMATION: Date: 01/04/23 Time: Location:[facility]

## 2023-01-04 ENCOUNTER — Encounter: Admission: RE | Payer: Self-pay | Source: Home / Self Care

## 2023-01-04 ENCOUNTER — Ambulatory Visit: Admission: RE | Admit: 2023-01-04 | Payer: 59 | Source: Home / Self Care | Admitting: Gastroenterology

## 2023-01-04 ENCOUNTER — Telehealth: Payer: Self-pay | Admitting: *Deleted

## 2023-01-04 SURGERY — COLONOSCOPY WITH PROPOFOL
Anesthesia: General

## 2023-08-31 NOTE — Telephone Encounter (Signed)
 A user error has taken place: encounter opened in error, closed for administrative reasons.
